# Patient Record
Sex: Female | Born: 1945
Health system: Southern US, Community
[De-identification: ages and names within clinical notes are randomized; demographics above are authoritative.]

## PROBLEM LIST (undated history)

## (undated) DIAGNOSIS — K900 Celiac disease: Secondary | ICD-10-CM

## (undated) DIAGNOSIS — I10 Essential (primary) hypertension: Secondary | ICD-10-CM

## (undated) HISTORY — PX: EYE SURGERY: SHX253

## (undated) HISTORY — PX: VAGINAL HYSTERECTOMY: SUR661

---

## 2000-03-07 ENCOUNTER — Other Ambulatory Visit: Admission: RE | Admit: 2000-03-07 | Discharge: 2000-03-07 | Payer: Self-pay | Admitting: Obstetrics and Gynecology

## 2001-03-05 ENCOUNTER — Other Ambulatory Visit: Admission: RE | Admit: 2001-03-05 | Discharge: 2001-03-05 | Payer: Self-pay | Admitting: Obstetrics and Gynecology

## 2001-10-25 ENCOUNTER — Ambulatory Visit (HOSPITAL_COMMUNITY): Admission: RE | Admit: 2001-10-25 | Discharge: 2001-10-25 | Payer: Self-pay | Admitting: *Deleted

## 2002-06-04 ENCOUNTER — Other Ambulatory Visit: Admission: RE | Admit: 2002-06-04 | Discharge: 2002-06-04 | Payer: Self-pay | Admitting: Obstetrics and Gynecology

## 2004-02-11 ENCOUNTER — Ambulatory Visit (HOSPITAL_COMMUNITY): Admission: RE | Admit: 2004-02-11 | Discharge: 2004-02-11 | Payer: Self-pay | Admitting: Internal Medicine

## 2004-03-04 ENCOUNTER — Ambulatory Visit (HOSPITAL_COMMUNITY): Admission: RE | Admit: 2004-03-04 | Discharge: 2004-03-04 | Payer: Self-pay | Admitting: Internal Medicine

## 2006-04-19 ENCOUNTER — Ambulatory Visit (HOSPITAL_COMMUNITY): Admission: RE | Admit: 2006-04-19 | Discharge: 2006-04-19 | Payer: Self-pay | Admitting: *Deleted

## 2006-04-19 ENCOUNTER — Encounter (INDEPENDENT_AMBULATORY_CARE_PROVIDER_SITE_OTHER): Payer: Self-pay | Admitting: Specialist

## 2008-04-16 ENCOUNTER — Encounter: Admission: RE | Admit: 2008-04-16 | Discharge: 2008-04-16 | Payer: Self-pay | Admitting: Internal Medicine

## 2010-05-18 HISTORY — DX: Hereditary hemochromatosis: E83.110

## 2010-08-26 ENCOUNTER — Other Ambulatory Visit (HOSPITAL_COMMUNITY): Payer: Self-pay | Admitting: Gastroenterology

## 2010-08-26 DIAGNOSIS — R7989 Other specified abnormal findings of blood chemistry: Secondary | ICD-10-CM

## 2010-09-08 ENCOUNTER — Other Ambulatory Visit: Payer: Self-pay | Admitting: Interventional Radiology

## 2010-09-08 ENCOUNTER — Ambulatory Visit (HOSPITAL_COMMUNITY)
Admission: RE | Admit: 2010-09-08 | Discharge: 2010-09-08 | Disposition: A | Discharge status: Home / Self Care | Payer: Medicare Other | Source: Ambulatory Visit | Attending: Gastroenterology | Admitting: Gastroenterology

## 2010-09-08 DIAGNOSIS — R945 Abnormal results of liver function studies: Secondary | ICD-10-CM | POA: Insufficient documentation

## 2010-09-08 DIAGNOSIS — R7989 Other specified abnormal findings of blood chemistry: Secondary | ICD-10-CM

## 2010-09-08 LAB — PROTIME-INR
INR: 0.88 (ref 0.00–1.49)
Prothrombin Time: 12.1 seconds (ref 11.6–15.2)

## 2010-09-08 LAB — APTT: aPTT: 28 seconds (ref 24–37)

## 2010-09-08 LAB — CBC
HCT: 40.4 % (ref 36.0–46.0)
MCHC: 34.9 g/dL (ref 30.0–36.0)

## 2010-10-05 ENCOUNTER — Encounter (HOSPITAL_BASED_OUTPATIENT_CLINIC_OR_DEPARTMENT_OTHER): Payer: Medicare Other | Admitting: Oncology

## 2010-10-05 ENCOUNTER — Other Ambulatory Visit: Payer: Self-pay | Admitting: Oncology

## 2010-10-05 LAB — COMPREHENSIVE METABOLIC PANEL
AST: 31 U/L (ref 0–37)
Albumin: 4.4 g/dL (ref 3.5–5.2)
CO2: 23 mEq/L (ref 19–32)
Calcium: 9 mg/dL (ref 8.4–10.5)
Chloride: 105 mEq/L (ref 96–112)
Glucose, Bld: 99 mg/dL (ref 70–99)
Potassium: 4.4 mEq/L (ref 3.5–5.3)
Sodium: 139 mEq/L (ref 135–145)
Total Bilirubin: 0.4 mg/dL (ref 0.3–1.2)

## 2010-10-05 LAB — CBC & DIFF AND RETIC
EOS%: 1.5 % (ref 0.0–7.0)
HCT: 41 % (ref 34.8–46.6)
Immature Retic Fract: 3.3 % (ref 0.00–10.70)
MCH: 34.7 pg — ABNORMAL HIGH (ref 25.1–34.0)
MCHC: 34.1 g/dL (ref 31.5–36.0)
MCV: 101.7 fL — ABNORMAL HIGH (ref 79.5–101.0)
MONO#: 0.5 10*3/uL (ref 0.1–0.9)
NEUT#: 2.7 10*3/uL (ref 1.5–6.5)
NEUT%: 52.4 % (ref 38.4–76.8)
Platelets: 246 10*3/uL (ref 145–400)
Retic %: 1.28 % (ref 0.50–1.50)
WBC: 5.2 10*3/uL (ref 3.9–10.3)

## 2010-10-05 LAB — FERRITIN: Ferritin: 461 ng/mL — ABNORMAL HIGH (ref 10–291)

## 2010-10-05 LAB — IRON AND TIBC: TIBC: 266 ug/dL (ref 250–470)

## 2010-10-25 ENCOUNTER — Encounter (HOSPITAL_COMMUNITY): Payer: Medicare Other | Attending: Oncology

## 2010-11-01 ENCOUNTER — Encounter (HOSPITAL_COMMUNITY): Payer: Medicare Other | Attending: Oncology

## 2010-11-01 ENCOUNTER — Other Ambulatory Visit: Payer: Self-pay | Admitting: Oncology

## 2010-11-01 LAB — HEMOGLOBIN AND HEMATOCRIT, BLOOD
HCT: 39.9 % (ref 36.0–46.0)
Hemoglobin: 13.3 g/dL (ref 12.0–15.0)

## 2010-11-01 LAB — FERRITIN: Ferritin: 360 ng/mL — ABNORMAL HIGH (ref 10–291)

## 2010-11-22 ENCOUNTER — Other Ambulatory Visit: Payer: Self-pay | Admitting: Oncology

## 2010-11-22 ENCOUNTER — Encounter (HOSPITAL_COMMUNITY)
Admission: RE | Admit: 2010-11-22 | Discharge: 2010-11-22 | Disposition: A | Discharge status: Home / Self Care | Payer: Medicare Other | Source: Ambulatory Visit | Attending: Oncology | Admitting: Oncology

## 2010-11-22 LAB — FERRITIN: Ferritin: 277 ng/mL (ref 10–291)

## 2010-12-16 NOTE — Op Note (Signed)
NAME:  Jessica Conley, Jessica Conley NO.:  [id number]   MEDICAL RECORD NO.:  [id number]          PATIENT TYPE:  AMB   LOCATION:  ENDO                         FACILITY:  [facility]   PHYSICIAN:  Waverly Ferrari, M.D.    DATE OF BIRTH:  09-Aug-1945   DATE OF PROCEDURE:  04/19/2006  DATE OF DISCHARGE:                                 OPERATIVE REPORT   PROCEDURE:  Enteroscopy.   INDICATIONS:  Question of celiac disease.   ANESTHESIA:  Demerol 50 mg, Versed 5 mg.   PROCEDURE:  With the patient mildly sedated in the left lateral decubitus  position, the Olympus videoscopic pediatric colonoscope was inserted into  the mouth and then passed under direct vision into the esophagus, which  appeared normal until we reached the distal esophagus and there appeared to  be some possibly area of Barrett's, photographed and biopsied.  We entered  into the stomach.  The fundus, body, antrum appeared normal.  The duodenal  bulb showed changes of duodenitis that we subsequently biopsied.  We passed  the enteroscope further to the small bowel, and scalloping was seen of the  small intestinal mucosa.  This was photographed and biopsies were taken to  rule out celiac sprue.  The endoscope was then withdrawn taking  circumferential views of the duodenal mucosa until the endoscope had been  pulled back into the stomach, placed in retroflexion to view the stomach  from below, and a hiatal hernia was seen.  The endoscope was straightened  and withdrawn taking circumferential views of the remaining gastric and  esophageal mucosa, stopping to biopsy the distal esophagus.  The patient's  vital signs and pulse oximeter remained stable.  The patient tolerated the  procedure well without apparent complication.   FINDINGS:  1. Changes of possible celiac sprue seen in the small bowel.  2. Duodenitis limited to the duodenal bulb.  3. Question of Barrett's esophagus versus mild esophagitis of the distal  esophagus.  4. Hiatal hernia.   PLAN:  Await biopsy report.  The patient will call me for results and follow  up with me as an outpatient.           ______________________________  Waverly Ferrari, M.D.     GMO/MEDQ  D:  04/19/2006  T:  04/20/2006  Job:  [id number]

## 2010-12-16 NOTE — Procedures (Signed)
[facility]  Patient:    Jessica Conley, Jessica Conley Visit Number: [id number] MRN: [id number]          Service Type: END Location: ENDO Attending Physician:  Jim Desanctis Dictated by:   Jim Desanctis, M.D. Proc. Date: 10/25/01 Admit Date:  10/25/2001                             Procedure Report  PROCEDURE:  Colonoscopy.  INDICATION FOR PROCEDURE:  Colon cancer screening.  ANESTHESIA:  None further given.  DESCRIPTION OF PROCEDURE:  With the patient mildly sedated in the left lateral decubitus position, the Olympus videoscopic colonoscope was inserted in the rectum and passed under direct vision to the cecum identified by the ileocecal valve and appendiceal orifice. From this point, the colonoscope was slowly withdrawn taking circumferential views of the entire colonic mucosa, stopping only in the rectum which appeared normal in direct and showed hemorrhoids in retroflexed view. The endoscope was straightened and withdrawn. The patients vital signs and pulse oximeter remained stable. The patient tolerated the procedure well without apparent complications.  FINDINGS:  Internal hemorrhoids otherwise unremarkable examination. Dictated by:   Jim Desanctis, M.D. Attending Physician:  Jim Desanctis DD:  10/25/01 TD:  10/25/01 Job: [id number] [id number]

## 2010-12-16 NOTE — Procedures (Signed)
[facility]  Patient:    Jessica Conley, Jessica Conley Visit Number: [id number] MRN: [id number]          Service Type: END Location: ENDO Attending Physician:  Jim Desanctis Dictated by:   Jim Desanctis, M.D. Proc. Date: 10/25/01 Admit Date:  10/25/2001                             Procedure Report  PROCEDURE:  Upper endoscopy.  INDICATIONS:  Gastroesophageal reflux disease.  ANESTHESIA:  Demerol 70 mg, Versed 6 mg.  DESCRIPTION OF PROCEDURE:  With the patient mildly sedated in the left lateral decubitus position, the Olympus videoscopic endoscope was inserted in the mouth and passed under direct vision through the esophagus, which appeared normal into the stomach. Fundus, body, antrum, duodenal bulb, second portion of the duodenum were all visualized. From this point, the endoscope was slowly withdrawn taking circumferential views of the entire duodenal mucosa until the endoscope was then pulled back into the stomach, placed in retroflexion to view the stomach from below. The endoscope was then straightened and withdrawn taking circumferential views of the remaining gastric and esophageal mucosa. The patients vital signs and pulse oximeter remained stable. The patient tolerated the procedure well without apparent complications.  FINDINGS:  Very mild duodenitis, which could possibly have been secondary to the prep.  PLAN:  Continue present therapy. Proceed to colonoscopy as planned. Dictated by:   Jim Desanctis, M.D. Attending Physician:  Jim Desanctis DD:  10/25/01 TD:  10/25/01 Job: [id number]

## 2010-12-20 ENCOUNTER — Encounter (HOSPITAL_COMMUNITY): Payer: Medicare Other | Attending: Oncology

## 2011-08-07 ENCOUNTER — Encounter (HOSPITAL_COMMUNITY): Payer: Self-pay

## 2011-08-07 ENCOUNTER — Other Ambulatory Visit (HOSPITAL_COMMUNITY): Payer: Self-pay | Admitting: *Deleted

## 2011-08-07 ENCOUNTER — Ambulatory Visit (HOSPITAL_COMMUNITY)
Admission: RE | Admit: 2011-08-07 | Discharge: 2011-08-07 | Disposition: A | Discharge status: Home / Self Care | Payer: Medicare Other | Source: Ambulatory Visit | Attending: Internal Medicine | Admitting: Internal Medicine

## 2011-08-07 HISTORY — DX: Essential (primary) hypertension: I10

## 2011-08-07 HISTORY — DX: Hereditary hemochromatosis: E83.110

## 2011-08-07 MED ORDER — SODIUM CHLORIDE 0.9 % IV BOLUS (SEPSIS)
500.0000 mL | Freq: Once | INTRAVENOUS | Status: AC
  Administered 2011-08-07: 500 mL via INTRAVENOUS

## 2011-08-07 NOTE — Procedures (Signed)
Right a/c #18 ga and 500 cc blood obtained over 12 minutes.  VSS prior to phlebotomy were 163/93 At the end of procedure 81/42 p 49 Pt c/o of some dizziness HOB placed in flat postion. BP decreased to 62/37 p 48 Saline connected to IV site wide open >BP74/79 Placed call to Dr Maudie Mercury and received orders . Continuing to monitor pt. Pt awake and alert ,pink slightly cool BP 91/60 p= 48  BP 105/70 p=66

## 2011-08-07 NOTE — Progress Notes (Signed)
After IV bolus of 500 cc and 2 glasses of juice HOB 45 degrees and BP 125/82 Sitting on edge of bed 124/80  p 66

## 2011-08-10 ENCOUNTER — Other Ambulatory Visit (HOSPITAL_COMMUNITY): Payer: Self-pay | Admitting: *Deleted

## 2011-08-14 ENCOUNTER — Encounter (HOSPITAL_COMMUNITY)
Admission: RE | Admit: 2011-08-14 | Discharge: 2011-08-14 | Disposition: A | Discharge status: Home / Self Care | Payer: Medicare Other | Source: Ambulatory Visit | Attending: Internal Medicine | Admitting: Internal Medicine

## 2011-08-14 ENCOUNTER — Encounter (HOSPITAL_COMMUNITY): Payer: Self-pay

## 2011-08-14 MED ORDER — SODIUM CHLORIDE 0.9 % IV SOLN
INTRAVENOUS | Status: DC
  Administered 2011-08-14: 14:00:00 via INTRAVENOUS

## 2011-08-14 NOTE — Procedures (Signed)
Removed 250cc blood over 15 minutes after an infusion of 500 cc NS BP 138/80 p84

## 2011-08-15 ENCOUNTER — Encounter (HOSPITAL_COMMUNITY): Payer: Medicare Other

## 2011-08-16 ENCOUNTER — Encounter (HOSPITAL_COMMUNITY): Payer: Medicare Other

## 2011-08-17 ENCOUNTER — Encounter (HOSPITAL_COMMUNITY): Payer: Medicare Other

## 2011-08-18 ENCOUNTER — Encounter (HOSPITAL_COMMUNITY): Payer: Medicare Other

## 2011-08-21 ENCOUNTER — Encounter (HOSPITAL_COMMUNITY)
Admission: RE | Admit: 2011-08-21 | Discharge: 2011-08-21 | Disposition: A | Discharge status: Home / Self Care | Payer: Medicare Other | Source: Ambulatory Visit | Attending: Internal Medicine | Admitting: Internal Medicine

## 2011-08-21 ENCOUNTER — Encounter (HOSPITAL_COMMUNITY): Payer: Self-pay

## 2011-08-21 ENCOUNTER — Encounter (HOSPITAL_COMMUNITY): Payer: Medicare Other

## 2011-08-21 HISTORY — PX: PHLEBOTOMY THERAPEUTIC: PRO51

## 2011-08-21 MED ORDER — SODIUM CHLORIDE 0.9 % IV BOLUS (SEPSIS)
500.0000 mL | INTRAVENOUS | Status: DC
  Administered 2011-08-21 (×2): 500 mL via INTRAVENOUS

## 2011-08-21 NOTE — Discharge Instructions (Signed)
Phlebotomy, Pediatric There are times when children need to have blood tests done as part of routine health care or due to illness. In most cases, blood is obtained by inserting a needle into a vein and drawing a blood sample (phlebotomy) into a special tube or into a syringe.  In newborns, blood may be obtained by making a tiny puncture on a specific area of the heel (called a "heel stick"). This is usually done by using a tiny needle called a "lancet". Drops of blood are then collected in small tubes which are sent to the lab. This same method can be used for infants and older children to obtain small amounts of bloods from the fingers. When larger amounts of blood are needed from newborn babies, the veins on the arms, hands, tops of feet, on the scalp, and on the neck might also be used to obtain blood samples.  LET YOUR CAREGIVER KNOW ABOUT:   Allergies.   History of severe bleeding problems.   Previous reaction to numbing medicines.   Current medications, including medications on the skin, in the eyes or mouth.  RISKS AND COMPLICATIONS  Most needle sticks to draw blood are quick, relatively painless, and without side effects or complications. However, as with any procedure, there are always possible risks. They include:   Pain.   Inability to locate the vein and/or obtain blood.   Bleeding from the site after the needle is removed.   Bruising at the site.   Allergic reaction to numbing medicine used before the needle is inserted.  BEFORE THE PROCEDURE  Warming devices may be used to help increase the blood supply in the vein or in the area of the heel where the blood will be obtained. This may make it easier to obtain the blood.  In infants, toddlers, and young children, it is usually necessary to do something to hold the child very still. Parents may be asked to both hold the child still and help comfort the child at the same time. Restraining the child tightly may seem cruel, but it  actually makes it quicker, easier, and safer to obtain the blood.  Before inserting the needle, the skin over the vein or the heel is cleansed with alcohol or another antiseptic solution. The skin over the vein may be covered with an anesthetic cream to reduce pain when the needle is passed through the skin. Distraction of the child through reading, talking, singing, or playing may help to make the procedure quicker and more comfortable for the child.  AFTER THE PROCEDURE  After the blood is obtained and the needle removed, a clean gauze pad is held over the site and pressure is applied to stop bleeding and minimize swelling. The gauze is then either taped in place or replaced with an adhesive bandage. HOME CARE INSTRUCTIONS   Keep the bandage in place for 2 to 3 hours before removal. Heavy activity at the puncture site (such as throwing a ball or playing on a jungle gym) should be avoided during this time if an arm vein was used.   After 2 to 3 hours, the gauze may be removed and discarded. If there is any oozing of blood cover the site with a clean bandage strip.   Generally, pain only occurs while the needle is in place, but after removal, the pain goes away quickly. Only take over-the-counter or prescription medicines for pain, discomfort, or fever as directed by your caregiver.   Showering may be done normally  after the gauze is removed. If bleeding recurs while showering, the site should be dabbed dry (avoid rubbing the area) and an adhesive bandage applied.  SEEK MEDICAL CARE IF:   A large bruise at the site of needle insertion.   The site continues to ooze blood after several hours.   A rash develops around the needle site.   Fever develops.  SEEK IMMEDIATE MEDICAL CARE IF:   Bleeding from the site develops and will not stop even after applying firm pressure to the site for 10 minutes.   Red streaking or tenderness develops above and/or below the site.   There is loss of  sensation and/or weakness in the arm or the leg where the needle had been placed.   There is intense pain in the arm or leg where the needle had been placed.  Document Released: 11/28/2006 Document Revised: 03/29/2011 Document Reviewed: 07/05/2007 [facility] Patient Information 2012 [place].

## 2011-08-21 NOTE — Procedures (Addendum)
250cc of whole blood taken from pt via 20 gauge angiocath left arm. Pt tolerated well. To stay 1 hour post phlebotomy.  Pt received 400 cc bolus of NS. To receive last 100 cc of saline over 1 hour post phlebotomy

## 2011-08-23 ENCOUNTER — Emergency Department (HOSPITAL_COMMUNITY)
Admission: EM | Admit: 2011-08-23 | Discharge: 2011-08-23 | Disposition: A | Discharge status: Home / Self Care | Payer: Medicare Other | Attending: Emergency Medicine | Admitting: Emergency Medicine

## 2011-08-23 ENCOUNTER — Other Ambulatory Visit: Payer: Self-pay

## 2011-08-23 ENCOUNTER — Encounter (HOSPITAL_COMMUNITY): Payer: Self-pay | Admitting: Emergency Medicine

## 2011-08-23 DIAGNOSIS — R531 Weakness: Secondary | ICD-10-CM

## 2011-08-23 DIAGNOSIS — R5381 Other malaise: Secondary | ICD-10-CM | POA: Insufficient documentation

## 2011-08-23 DIAGNOSIS — I10 Essential (primary) hypertension: Secondary | ICD-10-CM | POA: Insufficient documentation

## 2011-08-23 DIAGNOSIS — R42 Dizziness and giddiness: Secondary | ICD-10-CM

## 2011-08-23 DIAGNOSIS — R55 Syncope and collapse: Secondary | ICD-10-CM | POA: Insufficient documentation

## 2011-08-23 HISTORY — DX: Celiac disease: K90.0

## 2011-08-23 LAB — URINALYSIS, ROUTINE W REFLEX MICROSCOPIC
Bilirubin Urine: NEGATIVE
Glucose, UA: NEGATIVE mg/dL
Hgb urine dipstick: NEGATIVE
Specific Gravity, Urine: 1.011 (ref 1.005–1.030)
Urobilinogen, UA: 0.2 mg/dL (ref 0.0–1.0)
pH: 8 (ref 5.0–8.0)

## 2011-08-23 LAB — DIFFERENTIAL
Eosinophils Absolute: 0 10*3/uL (ref 0.0–0.7)
Lymphocytes Relative: 22 % (ref 12–46)
Lymphs Abs: 1.4 10*3/uL (ref 0.7–4.0)
Monocytes Relative: 7 % (ref 3–12)
Neutro Abs: 4.5 10*3/uL (ref 1.7–7.7)
Neutrophils Relative %: 71 % (ref 43–77)

## 2011-08-23 LAB — BASIC METABOLIC PANEL
BUN: 8 mg/dL (ref 6–23)
CO2: 26 mEq/L (ref 19–32)
Chloride: 105 mEq/L (ref 96–112)
Glucose, Bld: 114 mg/dL — ABNORMAL HIGH (ref 70–99)
Potassium: 3.5 mEq/L (ref 3.5–5.1)
Sodium: 142 mEq/L (ref 135–145)

## 2011-08-23 LAB — CBC
Hemoglobin: 12.6 g/dL (ref 12.0–15.0)
MCH: 35.2 pg — ABNORMAL HIGH (ref 26.0–34.0)
Platelets: 286 10*3/uL (ref 150–400)
RBC: 3.58 MIL/uL — ABNORMAL LOW (ref 3.87–5.11)
WBC: 6.4 10*3/uL (ref 4.0–10.5)

## 2011-08-23 MED ORDER — SODIUM CHLORIDE 0.9 % IV BOLUS (SEPSIS)
1000.0000 mL | Freq: Once | INTRAVENOUS | Status: AC
  Administered 2011-08-23: 1000 mL via INTRAVENOUS

## 2011-08-23 NOTE — ED Notes (Signed)
Had episode of weakness while attending funeral this morning. Has given blood on Monday- hx of hemochromotosis-feels weak sometimes after giving blood. To ED via [facility] A/Ox3 pink, w/d.

## 2011-08-23 NOTE — ED Provider Notes (Addendum)
History     CSN: [id number]  Arrival date & time 08/23/11  7106   First MD Initiated Contact with Patient 08/23/11 1759      Chief Complaint  Patient presents with  . Near Syncope  . Weakness    (Consider location/radiation/quality/duration/timing/severity/associated sxs/prior treatment) Patient is a 66 y.o. female presenting with weakness. The history is provided by the patient.  Weakness Primary symptoms do not include headaches, loss of consciousness, dizziness, paresthesias, focal weakness, loss of sensation, speech change, fever, nausea or vomiting. Primary symptoms comment: Near-syncope The symptoms began 6 to 12 hours ago (Patient has had these intermittent symptoms for the last 2 weeks. She started treatment for hemochromatosis where she gives blood which started 2 weeks ago the first day she gave blood she syncopized but she has not passed out since. ). Episode duration: She started feeling faint and lightheaded while she was having funeral at 11 AM this morning. The symptoms are unchanged. The neurological symptoms are diffuse. The symptoms occurred after standing up and on exertion (Worse with standing).  Additional symptoms include weakness. Additional symptoms do not include pain, loss of balance, photophobia, tinnitus or vertigo. Medical issues do not include seizures, cerebral vascular accident, cancer or diabetes.    Past Medical History  Diagnosis Date  . Hemochromatosis, hereditary   . Hypertension   . Celiac sprue     Past Surgical History  Procedure Date  . Eye surgery   . Abdominal hysterectomy   . Phlebotomy therapeutic 08/21/2011         History reviewed. No pertinent family history.  History  Substance Use Topics  . Smoking status: Never Smoker   . Smokeless tobacco: Not on file  . Alcohol Use: 0.6 oz/week    1 Glasses of wine per week    OB History    Grav Para Term Preterm Abortions TAB SAB Ect Mult Living                  Review of  Systems  Constitutional: Negative for fever.  HENT: Negative for tinnitus.   Eyes: Negative for photophobia.  Respiratory: Negative for cough and shortness of breath.   Cardiovascular: Negative for chest pain and leg swelling.  Gastrointestinal: Negative for nausea and vomiting.  Neurological: Positive for weakness and light-headedness. Negative for dizziness, vertigo, speech change, focal weakness, loss of consciousness, headaches, paresthesias and loss of balance.  All other systems reviewed and are negative.    Allergies  Penicillins  Home Medications   Current Outpatient Rx  Name Route Sig Dispense Refill  . CALCIUM + D PO Oral Take 1 tablet by mouth daily.    Marland Kitchen VITAMIN D 1000 UNITS PO TABS Oral Take 1,000 Units by mouth daily.     Marland Kitchen VITAMIN B-12 SL Sublingual Place 1 tablet under the tongue daily.    Marland Kitchen FOLIC ACID PO Oral Take 1 tablet by mouth daily.    Marland Kitchen LOSARTAN POTASSIUM 50 MG PO TABS Oral Take 50 mg by mouth daily.    . ADULT MULTIVITAMIN W/MINERALS CH Oral Take 1 tablet by mouth daily.    Marland Kitchen PRAVASTATIN SODIUM 20 MG PO TABS Oral Take 20 mg by mouth daily.      BP 137/89  Pulse 94  Temp(Src) 97.5 F (36.4 C) (Oral)  Resp 16  SpO2 100%  Physical Exam  Nursing note and vitals reviewed. Constitutional: She is oriented to person, place, and time. She appears well-developed and well-nourished. No distress.  HENT:  Head: Normocephalic and atraumatic.  Mouth/Throat: Oropharynx is clear and moist.  Eyes: EOM are normal. Pupils are equal, round, and reactive to light.  Cardiovascular: Normal rate, regular rhythm, normal heart sounds and intact distal pulses.  Exam reveals no friction rub.   No murmur heard. Pulmonary/Chest: Effort normal and breath sounds normal. She has no wheezes. She has no rales.  Abdominal: Soft. Bowel sounds are normal. She exhibits no distension. There is no tenderness. There is no rebound and no guarding.  Musculoskeletal: Normal range of motion.  She exhibits no tenderness.       No edema  Neurological: She is alert and oriented to person, place, and time. No cranial nerve deficit.  Skin: Skin is warm and dry. No rash noted. No pallor.  Psychiatric: She has a normal mood and affect. Her behavior is normal.    ED Course  Procedures (including critical care time)  Labs Reviewed  CBC - Abnormal; Notable for the following:    RBC 3.58 (*)    MCV 101.7 (*)    MCH 35.2 (*)    All other components within normal limits  BASIC METABOLIC PANEL - Abnormal; Notable for the following:    Glucose, Bld 114 (*)    All other components within normal limits  DIFFERENTIAL  URINALYSIS, ROUTINE W REFLEX MICROSCOPIC   No results found.   Date: 08/23/2011  Rate: 93  Rhythm: normal sinus rhythm  QRS Axis: left  Intervals: normal  ST/T Wave abnormalities: normal  Conduction Disutrbances:none  Narrative Interpretation:   Old EKG Reviewed: none available    No diagnosis found.    MDM   Patient with a complaint of generalized weakness there has been intermittent for the last 2 weeks. She has a history of hemochromatosis and gets intermittent blood draws. She started the treatments again 2 weeks ago where she gave a pint of blood and afterwards felt very weak and syncopized. She states that she's felt felt intermittently weak since the first time getting blood 2 weeks ago. She gave again on Monday and states that she felt okay but today while she was sitting at a funeral she felt very lightheaded and weak. There is no focal symptoms suggestive of stroke she denies any infectious symptoms. She has no cardiac symptoms such as chest pain, shortness of breath, diaphoresis, nausea, vomiting. She denies any change in medications she has not missed any medications. She states the last time she was going through hemochromatosis the treatments she had similar symptoms which was back in July. On exam today patient is asymptomatic with lying and has a  normal exam. Orthostatics she had a blood pressure of 143/77 lying and no 137/89 standing. Without a significant change in pulse. She has no symptoms suggestive of a PE or infectious process. EKG within normal limits. CBC within normal blood count of 12. BMP and UA pending.   9:11 PM Patient with normal BMP and UA. An iron ferritin was sent and her doctor can followup on that.  After fluid she had mild improvement. Will have her followup with Dr. Maudie Mercury for further treatment.  Patient was able to ambulate to the bathroom without difficulty    Blanchie Dessert, MD 08/23/11 2112  Blanchie Dessert, MD 08/23/11 2134

## 2011-08-23 NOTE — ED Notes (Signed)
JHH:[id number]<BD> Expected date:08/23/11<BR> Expected time: 5:12 PM<BR> Means of arrival:Ambulance<BR> Comments:<BR> EMS 72 GC, 54 yof near syncope

## 2011-08-24 LAB — FERRITIN: Ferritin: 174 ng/mL (ref 10–291)

## 2011-08-28 ENCOUNTER — Encounter: Payer: Self-pay | Admitting: Oncology

## 2011-08-28 ENCOUNTER — Encounter (HOSPITAL_COMMUNITY): Admission: RE | Admit: 2011-08-28 | Payer: Medicare Other | Source: Ambulatory Visit

## 2011-09-04 ENCOUNTER — Encounter (HOSPITAL_COMMUNITY): Payer: Medicare Other

## 2011-09-11 ENCOUNTER — Encounter (HOSPITAL_COMMUNITY): Payer: Medicare Other

## 2011-09-15 ENCOUNTER — Other Ambulatory Visit (HOSPITAL_COMMUNITY): Payer: Self-pay | Admitting: *Deleted

## 2011-09-15 MED ORDER — SODIUM CHLORIDE 0.9 % IV BOLUS (SEPSIS): 500.0000 mL | INTRAVENOUS | Status: DC

## 2011-09-18 ENCOUNTER — Encounter (HOSPITAL_COMMUNITY): Payer: Medicare Other

## 2011-09-28 ENCOUNTER — Encounter (HOSPITAL_COMMUNITY)
Admission: RE | Admit: 2011-09-28 | Discharge: 2011-09-28 | Disposition: A | Discharge status: Home / Self Care | Payer: Medicare Other | Source: Ambulatory Visit | Attending: Internal Medicine | Admitting: Internal Medicine

## 2011-09-28 ENCOUNTER — Encounter (HOSPITAL_COMMUNITY): Payer: Self-pay

## 2011-09-28 LAB — CBC
HCT: 41.2 % (ref 36.0–46.0)
MCV: 102 fL — ABNORMAL HIGH (ref 78.0–100.0)
RBC: 4.04 MIL/uL (ref 3.87–5.11)
RDW: 12.1 % (ref 11.5–15.5)
WBC: 4.9 10*3/uL (ref 4.0–10.5)

## 2011-09-28 LAB — IRON AND TIBC
Iron: 129 ug/dL (ref 42–135)
Saturation Ratios: 48 % (ref 20–55)
TIBC: 270 ug/dL (ref 250–470)

## 2011-09-28 MED ORDER — SODIUM CHLORIDE 0.9 % IV SOLN: INTRAVENOUS | Status: DC

## 2011-09-28 NOTE — Procedures (Signed)
Prior to phlebotomy labs drawn and 250cc NS infused.  Phlebotomy started at 09:35am and ended at 09:45am, 250cc removed.  Pt tolerated well.  VSS. Pt stayed 9mnutes after procedure and left via ambulation.

## 2011-09-29 ENCOUNTER — Other Ambulatory Visit (HOSPITAL_COMMUNITY): Payer: Self-pay | Admitting: *Deleted

## 2011-10-23 ENCOUNTER — Inpatient Hospital Stay (HOSPITAL_COMMUNITY): Admission: RE | Admit: 2011-10-23 | Payer: Medicare Other | Source: Ambulatory Visit

## 2011-10-23 ENCOUNTER — Inpatient Hospital Stay (HOSPITAL_COMMUNITY)
Admission: RE | Admit: 2011-10-23 | Discharge: 2011-10-23 | Disposition: A | Discharge status: Home / Self Care | Payer: Medicare Other | Source: Ambulatory Visit | Attending: Internal Medicine | Admitting: Internal Medicine

## 2011-10-23 MED ORDER — SODIUM CHLORIDE 0.9 % IV SOLN
INTRAVENOUS | Status: DC
  Administered 2011-10-23: 16:00:00 via INTRAVENOUS

## 2011-10-23 NOTE — Discharge Instructions (Signed)
Return April 23 at 1030  Drink plenty of fluids today Light activity today

## 2011-10-24 ENCOUNTER — Encounter (HOSPITAL_COMMUNITY): Admission: RE | Admit: 2011-10-24 | Payer: Medicare Other | Source: Ambulatory Visit

## 2011-11-03 ENCOUNTER — Other Ambulatory Visit: Payer: Medicare Other | Admitting: Lab

## 2011-11-07 ENCOUNTER — Ambulatory Visit: Payer: Medicare Other | Admitting: Oncology

## 2011-11-08 ENCOUNTER — Encounter: Payer: Self-pay | Admitting: Oncology

## 2011-11-08 NOTE — Progress Notes (Signed)
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 Iron saturation 76%. Baseline ferritin 461. She was started on a every 2 week phlebotomy program beginning on 10/25/2010. She has not returned from his subsequent visits or lab monitoring. We will need to call and see if she still wants to be followed in our practice.  CC note Dr. Jani Gravel; Dr. Juanita Craver

## 2011-11-21 ENCOUNTER — Encounter (HOSPITAL_COMMUNITY): Payer: Self-pay

## 2011-11-21 ENCOUNTER — Encounter (HOSPITAL_COMMUNITY)
Admission: RE | Admit: 2011-11-21 | Discharge: 2011-11-21 | Disposition: A | Discharge status: Home / Self Care | Payer: Medicare Other | Source: Ambulatory Visit | Attending: Internal Medicine | Admitting: Internal Medicine

## 2011-11-21 LAB — CBC
Hemoglobin: 13.4 g/dL (ref 12.0–15.0)
MCH: 34.1 pg — ABNORMAL HIGH (ref 26.0–34.0)
MCV: 101.3 fL — ABNORMAL HIGH (ref 78.0–100.0)
RBC: 3.93 MIL/uL (ref 3.87–5.11)

## 2011-11-21 LAB — IRON AND TIBC
Saturation Ratios: 34 % (ref 20–55)
UIBC: 167 ug/dL (ref 125–400)

## 2011-11-21 MED ORDER — SODIUM CHLORIDE 0.9 % IV SOLN
INTRAVENOUS | Status: DC
  Administered 2011-11-21: 12:00:00 via INTRAVENOUS

## 2011-11-21 NOTE — Progress Notes (Signed)
Phlebotomized 250 mls of blood from patient's right AC. Patient tolerated well. Patient did not pass out during this treatment.

## 2011-11-21 NOTE — Discharge Instructions (Signed)
Therapeutic Phlebotomy Therapeutic phlebotomy is the controlled removal of blood from your body for the purpose of treating a medical condition. It is similar to donating blood. Usually, about a pint (470 mL) of blood is removed. The average adult has 9 to 12 pints (4.3 to 5.7 L) of blood. Therapeutic phlebotomy may be used to treat the following medical conditions:  Hemochromatosis. This is a condition in which there is too much iron in the blood.   Polycythemia vera. This is a condition in which there are too many red cells in the blood.   Porphyria cutanea tarda. This is a disease usually passed from one generation to the next (inherited). It is a condition in which an important part of hemoglobin is not made properly. This results in the build up of abnormal amounts of porphyrins in the body.   Sickle cell disease. This is an inherited disease. It is a condition in which the red blood cells form an abnormal crescent shape rather than a round shape.  LET YOUR CAREGIVER KNOW ABOUT:  Allergies.   Medicines taken including herbs, eyedrops, over-the-counter medicines, and creams.   Use of steroids (by mouth or creams).   Previous problems with anesthetics or numbing medicine.   History of blood clots.   History of bleeding or blood problems.   Previous surgery.   Possibility of pregnancy, if this applies.  RISKS AND COMPLICATIONS This is a simple and safe procedure. Problems are unlikely. However, problems can occur and may include:  Nausea or lightheadedness.   Low blood pressure.   Soreness, bleeding, swelling, or bruising at the needle insertion site.   Infection.  BEFORE THE PROCEDURE  This is a procedure that can be done as an outpatient. Confirm the time that you need to arrive for your procedure. Confirm whether there is a need to fast or withhold any medications. It is helpful to wear clothing with sleeves that can be raised above the elbow. A blood sample may be done  to determine the amount of red blood cells or iron in your blood. Plan ahead of time to have someone drive you home after the procedure. PROCEDURE The entire procedure from preparation through recovery takes about 1 hour. The actual collection takes about 10 to 15 minutes.  A needle will be inserted into your vein.   Tubing and a collection bag will be attached to that needle.   Blood will flow through the needle and tubing into the collection bag.   You may be asked to open and close your hand slowly and continuously during the entire collection.   Once the specified amount of blood has been removed from your body, the collection bag and tubing will be clamped.   The needle will be removed.   Pressure will be held on the site of the needle insertion to stop the bleeding. Then a bandage will be placed over the needle insertion site.  AFTER THE PROCEDURE  Your recovery will be assessed and monitored. If there are no problems, as an outpatient, you should be able to go home shortly after the procedure.  Document Released: 12/19/2010 Document Revised: 07/06/2011 Document Reviewed: 12/19/2010 [facility] Patient Information 2012 [place], [place].

## 2011-12-19 ENCOUNTER — Encounter (HOSPITAL_COMMUNITY): Admission: RE | Admit: 2011-12-19 | Payer: Medicare Other | Source: Ambulatory Visit

## 2012-05-17 ENCOUNTER — Other Ambulatory Visit (HOSPITAL_COMMUNITY): Payer: Self-pay | Admitting: *Deleted

## 2012-05-22 ENCOUNTER — Encounter (HOSPITAL_COMMUNITY): Payer: Self-pay

## 2012-05-22 ENCOUNTER — Encounter (HOSPITAL_COMMUNITY)
Admission: RE | Admit: 2012-05-22 | Discharge: 2012-05-22 | Disposition: A | Discharge status: Home / Self Care | Payer: Medicare Other | Source: Ambulatory Visit | Attending: Internal Medicine | Admitting: Internal Medicine

## 2012-05-22 MED ORDER — SODIUM CHLORIDE 0.9 % IV SOLN
INTRAVENOUS | Status: DC
  Administered 2012-05-22: 250 mL via INTRAVENOUS

## 2012-05-22 NOTE — Procedures (Signed)
Pt phlebotomized for 125 cc of whole blood. Kept for 30 minutes after procedure. Tolerated well.

## 2012-06-19 ENCOUNTER — Encounter (HOSPITAL_COMMUNITY)
Admission: RE | Admit: 2012-06-19 | Discharge: 2012-06-19 | Disposition: A | Discharge status: Home / Self Care | Payer: Medicare Other | Source: Ambulatory Visit | Attending: Internal Medicine | Admitting: Internal Medicine

## 2012-06-19 MED ORDER — SODIUM CHLORIDE 0.9 % IV SOLN
INTRAVENOUS | Status: DC
  Administered 2012-06-19: 250 mL via INTRAVENOUS

## 2012-06-19 NOTE — Procedures (Addendum)
After infusion of 250cc NS over 1 hour    , theraputic  phlebotomy of 125cc of blood obtained over 15 minutes. Pt tolerated this well

## 2012-07-17 ENCOUNTER — Encounter (HOSPITAL_COMMUNITY): Payer: Medicare Other

## 2012-07-26 ENCOUNTER — Inpatient Hospital Stay (HOSPITAL_COMMUNITY): Admission: RE | Admit: 2012-07-26 | Payer: Medicare Other | Source: Ambulatory Visit

## 2012-07-29 ENCOUNTER — Encounter (HOSPITAL_COMMUNITY): Payer: Self-pay

## 2012-07-29 ENCOUNTER — Encounter (HOSPITAL_COMMUNITY)
Admission: RE | Admit: 2012-07-29 | Discharge: 2012-07-29 | Disposition: A | Discharge status: Home / Self Care | Payer: Medicare Other | Source: Ambulatory Visit | Attending: Internal Medicine | Admitting: Internal Medicine

## 2012-07-29 MED ORDER — SODIUM CHLORIDE 0.9 % IV SOLN
INTRAVENOUS | Status: AC
  Administered 2012-07-29: 11:00:00 via INTRAVENOUS

## 2012-07-29 NOTE — Procedures (Signed)
Pt plblebotomized for 125cc of blood over 10 minutes. This is after 250 cc of NS. Tolerated well.

## 2012-08-12 ENCOUNTER — Other Ambulatory Visit (HOSPITAL_COMMUNITY): Payer: Self-pay | Admitting: Internal Medicine

## 2012-08-21 ENCOUNTER — Other Ambulatory Visit (HOSPITAL_COMMUNITY): Payer: Self-pay | Admitting: Internal Medicine

## 2012-09-03 ENCOUNTER — Encounter (HOSPITAL_COMMUNITY)
Admission: RE | Admit: 2012-09-03 | Discharge: 2012-09-03 | Disposition: A | Discharge status: Home / Self Care | Payer: Medicare Other | Source: Ambulatory Visit | Attending: Internal Medicine | Admitting: Internal Medicine

## 2012-09-03 ENCOUNTER — Encounter (HOSPITAL_COMMUNITY): Payer: Self-pay

## 2012-09-03 MED ORDER — SODIUM CHLORIDE 0.9 % IV SOLN
INTRAVENOUS | Status: DC
  Administered 2012-09-03: 15:00:00 via INTRAVENOUS

## 2012-09-03 NOTE — Procedures (Signed)
Phlebotomized 125 mls after 250 cc of normal saline infused. Patient tolerated phlebotomy.

## 2012-10-01 ENCOUNTER — Encounter (HOSPITAL_COMMUNITY): Payer: Self-pay

## 2012-10-01 ENCOUNTER — Encounter (HOSPITAL_COMMUNITY)
Admission: RE | Admit: 2012-10-01 | Discharge: 2012-10-01 | Disposition: A | Discharge status: Home / Self Care | Payer: Medicare Other | Source: Ambulatory Visit | Attending: Internal Medicine | Admitting: Internal Medicine

## 2012-10-01 MED ORDER — SODIUM CHLORIDE 0.9 % IV SOLN
INTRAVENOUS | Status: DC
  Administered 2012-10-01: 14:00:00 via INTRAVENOUS

## 2012-10-01 NOTE — Procedures (Signed)
Phlebotomize 140m of blood, started at 1450, completed at 1500, pt. Tolerated well.

## 2012-10-25 ENCOUNTER — Other Ambulatory Visit (HOSPITAL_COMMUNITY): Payer: Self-pay | Admitting: Internal Medicine

## 2012-10-29 ENCOUNTER — Encounter (HOSPITAL_COMMUNITY): Payer: Self-pay

## 2012-10-29 ENCOUNTER — Encounter (HOSPITAL_COMMUNITY)
Admission: RE | Admit: 2012-10-29 | Discharge: 2012-10-29 | Disposition: A | Discharge status: Home / Self Care | Payer: Medicare Other | Source: Ambulatory Visit | Attending: Internal Medicine | Admitting: Internal Medicine

## 2012-10-29 MED ORDER — SODIUM CHLORIDE 0.9 % IV SOLN
INTRAVENOUS | Status: AC
  Administered 2012-10-29: 14:00:00 via INTRAVENOUS

## 2012-10-29 NOTE — Procedures (Signed)
Phlebotomized 125 ml of blood. Start at 1523 and end at 1528. Tolerated well. No signs or symptoms noted. Patient stayed 15 minutes for observation. Instructed to call Dr.Kim for problems once discharged.

## 2012-11-01 ENCOUNTER — Encounter (HOSPITAL_COMMUNITY): Payer: Medicare Other

## 2012-11-26 ENCOUNTER — Encounter (HOSPITAL_COMMUNITY): Payer: Medicare Other

## 2012-11-29 ENCOUNTER — Encounter (HOSPITAL_COMMUNITY): Payer: Medicare Other

## 2012-12-03 ENCOUNTER — Encounter (HOSPITAL_COMMUNITY): Payer: Medicare Other

## 2013-01-24 ENCOUNTER — Other Ambulatory Visit (HOSPITAL_COMMUNITY): Payer: Self-pay | Admitting: *Deleted

## 2013-01-27 ENCOUNTER — Encounter (HOSPITAL_COMMUNITY): Payer: Medicare Other

## 2013-01-30 ENCOUNTER — Encounter (HOSPITAL_COMMUNITY): Payer: Self-pay

## 2013-01-30 ENCOUNTER — Encounter (HOSPITAL_COMMUNITY): Payer: Medicare Other

## 2013-01-30 ENCOUNTER — Encounter (HOSPITAL_COMMUNITY)
Admission: RE | Admit: 2013-01-30 | Discharge: 2013-01-30 | Disposition: A | Discharge status: Home / Self Care | Payer: Medicare Other | Source: Ambulatory Visit | Attending: Internal Medicine | Admitting: Internal Medicine

## 2013-01-30 MED ORDER — SODIUM CHLORIDE 0.9 % IV SOLN
Freq: Once | INTRAVENOUS | Status: AC
  Administered 2013-01-30: 08:00:00 via INTRAVENOUS

## 2013-01-30 NOTE — Procedures (Signed)
259m NS infusion given 1 hr prior to Therapeutic Phlebotomy done.  After infusion completed, 2549mBlood drawn out and discarded.  Pt tolerated well.  Reminded pt to continue with fluids today and stay hydrated, pt voiced understanding.

## 2013-01-30 NOTE — Progress Notes (Signed)
Pt states she is going to talk to her MD about her lab work and discuss the need for the therapeutic phlebotomy.  Pt states she will call back for an appointment after talking with her doctor.  Pt knows her ferritin level is WNL and her hgb is also WNL and pt doesn't understand why she needs the therapeutic phlebotomy.

## 2013-02-24 ENCOUNTER — Telehealth: Payer: Self-pay | Admitting: Oncology

## 2013-02-24 NOTE — Telephone Encounter (Signed)
Gave pt appt for lab and MD r/s from April pt was [facility]

## 2013-03-21 ENCOUNTER — Other Ambulatory Visit (HOSPITAL_BASED_OUTPATIENT_CLINIC_OR_DEPARTMENT_OTHER): Payer: Medicare Other | Admitting: Lab

## 2013-03-21 ENCOUNTER — Other Ambulatory Visit: Payer: Self-pay | Admitting: Oncology

## 2013-03-21 LAB — CBC WITH DIFFERENTIAL/PLATELET
Basophils Absolute: 0 10*3/uL (ref 0.0–0.1)
EOS%: 1.3 % (ref 0.0–7.0)
HGB: 13.4 g/dL (ref 11.6–15.9)
LYMPH%: 19.1 % (ref 14.0–49.7)
MCH: 33.4 pg (ref 25.1–34.0)
MCV: 100.5 fL (ref 79.5–101.0)
MONO%: 10.5 % (ref 0.0–14.0)
Platelets: 218 10*3/uL (ref 145–400)
RDW: 12.6 % (ref 11.2–14.5)

## 2013-03-21 LAB — COMPREHENSIVE METABOLIC PANEL (CC13)
AST: 23 U/L (ref 5–34)
Alkaline Phosphatase: 52 U/L (ref 40–150)
BUN: 13.6 mg/dL (ref 7.0–26.0)
Creatinine: 0.8 mg/dL (ref 0.6–1.1)
Potassium: 4.1 mEq/L (ref 3.5–5.1)
Total Bilirubin: 0.45 mg/dL (ref 0.20–1.20)

## 2013-03-28 ENCOUNTER — Ambulatory Visit (HOSPITAL_BASED_OUTPATIENT_CLINIC_OR_DEPARTMENT_OTHER): Payer: Medicare Other | Admitting: Nurse Practitioner

## 2013-03-28 ENCOUNTER — Telehealth: Payer: Self-pay | Admitting: Oncology

## 2013-03-28 DIAGNOSIS — K900 Celiac disease: Secondary | ICD-10-CM

## 2013-03-28 NOTE — Telephone Encounter (Signed)
gv pt appt schedule for November 2014 and February 2015.

## 2013-03-28 NOTE — Progress Notes (Signed)
OFFICE PROGRESS NOTE  Interval history:  Ms. Jimmye Norman is a 67 year old woman who is a homozygote for the C282Y hemachromatosis gene. She was initially seen by Dr. Beryle Beams on 10/05/2010.  To review, she was found to have liver function abnormalities by her primary doctor, Jaynee Eagles. Abdominal ultrasound showed fatty infiltration of the liver. Gene analysis on 05/18/2010 showed that she was homozygous for the C282Y hemachromatosis gene mutation. She underwent a transcutaneous liver biopsy on 09/08/2010 with pathology showing mild chronic portal inflammation associated with macrovesicular steatosis. Minimal portal fibrosis on trichrome stain. Iron stain showed 4+ predominantly periportal intrahepatocyte hemosiderin deposition. PAS stain negative for alpha-1 antitrypsin deposition.  Initial ferritin done in our office on 10/05/2010 returned at 461. She was started on a phlebotomy program which has been monitored by Dr. Maudie Mercury.  She was lost to followup. She returns today to discuss how frequently she needs to undergo phlebotomy. She thinks the most recent phlebotomy schedule was about every 2 months.  Per review of hospital records she most recently underwent phlebotomy on 01/30/2013. Prior to that phlebotomy hospital records indicate phlebotomy procedures on 10/29/2012, 10/01/2012, 09/03/2012, 07/29/2012, 06/19/2012, 05/22/2012, 11/21/2011, 09/28/2011, 08/21/2011, 08/14/2011, 08/07/2011.  She reports requiring IV fluids prior to each phlebotomy.  She overall is feeling well. Since her last visit in 2012 she was diagnosed with reflux and started on Prilosec. She has celiac disease and monitors her diet closely. She denies problems with diarrhea. She has an identical twin sister who she reports is not a homozygote for the C282Y hemachromatosis gene.   Objective: Blood pressure 146/79, pulse 91, temperature 98.1 F (36.7 C), temperature source Oral, resp. rate 19, height 5' 8.5" (1.74 m), weight 145  lb 11.2 oz (66.089 kg).  Oropharynx is without thrush or ulceration. No palpable cervical, supraclavicular or axillary lymph nodes. Lungs are clear. No wheezes or rales. Regular cardiac rhythm. Abdomen is soft and nontender. No organomegaly. Extremities are without edema.  Lab Results: Lab Results  Component Value Date   WBC 7.0 03/21/2013   HGB 13.4 03/21/2013   HCT 40.3 03/21/2013   MCV 100.5 03/21/2013   PLT 218 03/21/2013    Chemistry:    Chemistry      Component Value Date/Time   NA 140 03/21/2013 1135   NA 142 08/23/2011 1835   K 4.1 03/21/2013 1135   K 3.5 08/23/2011 1835   CL 105 08/23/2011 1835   CO2 24 03/21/2013 1135   CO2 26 08/23/2011 1835   BUN 13.6 03/21/2013 1135   BUN 8 08/23/2011 1835   CREATININE 0.8 03/21/2013 1135   CREATININE 0.64 08/23/2011 1835      Component Value Date/Time   CALCIUM 9.1 03/21/2013 1135   CALCIUM 10.2 08/23/2011 1835   ALKPHOS 52 03/21/2013 1135   ALKPHOS 44 10/05/2010 1141   AST 23 03/21/2013 1135   AST 31 10/05/2010 1141   ALT 35 03/21/2013 1135   ALT 47* 10/05/2010 1141   BILITOT 0.45 03/21/2013 1135   BILITOT 0.4 10/05/2010 1141     03/21/2013 ferritin 35  Studies/Results: No results found.  Medications: I have reviewed the patient's current medications.  Assessment/Plan:  1. Homozygote C282Y hemachromatosis gene. 2. Celiac disease.  Disposition-current ferritin is in the goal range (less than 150). She understand she will need to be on an intermittent phlebotomy program. Based on serial labs we will try to determine the best interval for the phlebotomies. She will return for labs in approximately 3 months with a possible phlebotomy  at that time. We will schedule a 6 month office visit. She will contact the office in the interim with any problems.  Plan reviewed with Dr. Beryle Beams.   CC Dr. Jani Gravel.  Ned Card ANP/GNP-BC

## 2013-06-10 ENCOUNTER — Other Ambulatory Visit (HOSPITAL_BASED_OUTPATIENT_CLINIC_OR_DEPARTMENT_OTHER): Payer: Medicare Other

## 2013-06-10 LAB — CBC WITH DIFFERENTIAL/PLATELET
Basophils Absolute: 0 10*3/uL (ref 0.0–0.1)
Eosinophils Absolute: 0.1 10*3/uL (ref 0.0–0.5)
HCT: 41.9 % (ref 34.8–46.6)
HGB: 14 g/dL (ref 11.6–15.9)
LYMPH%: 36.6 % (ref 14.0–49.7)
MCV: 100.1 fL (ref 79.5–101.0)
MONO#: 0.5 10*3/uL (ref 0.1–0.9)
MONO%: 10.8 % (ref 0.0–14.0)
NEUT#: 2.4 10*3/uL (ref 1.5–6.5)
Platelets: 238 10*3/uL (ref 145–400)
WBC: 4.8 10*3/uL (ref 3.9–10.3)

## 2013-06-23 ENCOUNTER — Telehealth: Payer: Self-pay | Admitting: *Deleted

## 2013-06-23 NOTE — Telephone Encounter (Signed)
Received call from pt asking for result of ferritin.  Message given per Dr Azucena Freed instructions.  She does not have any phlebotomies scheduled.  Return appt in Feb with labs.

## 2013-06-23 NOTE — Telephone Encounter (Signed)
Message copied by Jesse Fall on Mon Jun 23, 2013  4:04 PM ------      Message from: Ignacia Felling      Created: Fri Jun 20, 2013  5:42 PM                   ----- Message -----         From: Annia Belt, MD         Sent: 06/15/2013   3:14 PM           To: Ignacia Felling, RN, Amy Denyse Amass, RN            Call pt  Ferritin low at 24 - this is good - continue intermittent phlebotomies ------

## 2013-07-11 ENCOUNTER — Telehealth: Payer: Self-pay | Admitting: Oncology

## 2013-07-11 NOTE — Telephone Encounter (Signed)
Faxed pt labs to Dr. Rudean Haskell pt

## 2013-08-13 ENCOUNTER — Other Ambulatory Visit: Payer: Self-pay | Admitting: Oncology

## 2013-09-24 ENCOUNTER — Other Ambulatory Visit (HOSPITAL_BASED_OUTPATIENT_CLINIC_OR_DEPARTMENT_OTHER): Payer: Medicare Other

## 2013-09-24 LAB — COMPREHENSIVE METABOLIC PANEL (CC13)
ALBUMIN: 4.4 g/dL (ref 3.5–5.0)
ALK PHOS: 46 U/L (ref 40–150)
ALT: 58 U/L — ABNORMAL HIGH (ref 0–55)
AST: 35 U/L — AB (ref 5–34)
Anion Gap: 9 mEq/L (ref 3–11)
BUN: 17 mg/dL (ref 7.0–26.0)
CO2: 30 mEq/L — ABNORMAL HIGH (ref 22–29)
Calcium: 9.9 mg/dL (ref 8.4–10.4)
Chloride: 102 mEq/L (ref 98–109)
Creatinine: 0.9 mg/dL (ref 0.6–1.1)
GLUCOSE: 117 mg/dL (ref 70–140)
POTASSIUM: 4.7 meq/L (ref 3.5–5.1)
SODIUM: 140 meq/L (ref 136–145)
TOTAL PROTEIN: 7.9 g/dL (ref 6.4–8.3)
Total Bilirubin: 0.54 mg/dL (ref 0.20–1.20)

## 2013-09-24 LAB — CBC WITH DIFFERENTIAL/PLATELET
BASO%: 0.4 % (ref 0.0–2.0)
Basophils Absolute: 0 10*3/uL (ref 0.0–0.1)
EOS ABS: 0.1 10*3/uL (ref 0.0–0.5)
EOS%: 2 % (ref 0.0–7.0)
HCT: 42.7 % (ref 34.8–46.6)
HGB: 14.7 g/dL (ref 11.6–15.9)
LYMPH%: 30.1 % (ref 14.0–49.7)
MCH: 34.3 pg — ABNORMAL HIGH (ref 25.1–34.0)
MCHC: 34.4 g/dL (ref 31.5–36.0)
MCV: 99.7 fL (ref 79.5–101.0)
MONO#: 0.7 10*3/uL (ref 0.1–0.9)
MONO%: 10 % (ref 0.0–14.0)
NEUT%: 57.5 % (ref 38.4–76.8)
NEUTROS ABS: 3.9 10*3/uL (ref 1.5–6.5)
Platelets: 246 10*3/uL (ref 145–400)
RBC: 4.28 10*6/uL (ref 3.70–5.45)
RDW: 12.7 % (ref 11.2–14.5)
WBC: 6.8 10*3/uL (ref 3.9–10.3)
lymph#: 2 10*3/uL (ref 0.9–3.3)

## 2013-09-24 LAB — FERRITIN CHCC: FERRITIN: 28 ng/mL (ref 9–269)

## 2013-09-26 ENCOUNTER — Ambulatory Visit (HOSPITAL_BASED_OUTPATIENT_CLINIC_OR_DEPARTMENT_OTHER): Payer: Medicare Other | Admitting: Oncology

## 2013-09-26 NOTE — Progress Notes (Signed)
Followup visit for this 68 year old woman who is a homozygote for the  C282Y hemachromatosis gene. Highest recorded ferritin in our office 461 on  10/05/2010. She is on an intermittent phlebotomy program. She had a rapid fall in her ferritin as low as 24 by 06/10/2013. Most recent value 28 on 09/24/2013. Liver functions have been normal except for mild transient elevations of SGOT with value  58 today with lab normal up to 55. She does not tolerate the volume changes with full phlebotomy. She has had only 250 mL's phlebotomized at one time with concomitant saline fluid replacement. She has been getting procedures done at [facility] long short stay.  Of interest is the fact that she has an identical twin sister. They both have celiac disease but her sister did test negative for the hemachromatosis gene.  She's had no interim medical problems.  Physical exam not done today.  Impression  #1. Homozygote status for the hemachromatosis gene Ferritin is  so low that it is appropriate just to monitor periodic ferritins and reinitiate phlebotomy on an as needed basis if the ferritin begins to rise above 150. I propose that we check laboratory 3 months. She would like to continue to follow me at my hematology clinic at [facility].  #2. Adult celiac disease  100% of this visit spent with direct face-to-face contact with the patient in coordination of care.

## 2013-09-30 ENCOUNTER — Telehealth: Payer: Self-pay | Admitting: *Deleted

## 2013-09-30 NOTE — Telephone Encounter (Signed)
i sent and e-mail to MD and [facility] making them aware that the pt need a 4 month appt  w/ G...td

## 2013-10-07 ENCOUNTER — Ambulatory Visit (INDEPENDENT_AMBULATORY_CARE_PROVIDER_SITE_OTHER): Payer: Medicare Other | Admitting: Gynecology

## 2013-10-07 ENCOUNTER — Encounter: Payer: Self-pay | Admitting: Gynecology

## 2013-10-07 VITALS — BP 120/70 | Ht 68.0 in | Wt 147.0 lb

## 2013-10-07 DIAGNOSIS — N8111 Cystocele, midline: Secondary | ICD-10-CM

## 2013-10-07 DIAGNOSIS — N952 Postmenopausal atrophic vaginitis: Secondary | ICD-10-CM

## 2013-10-07 MED ORDER — ESTROGENS, CONJUGATED 0.625 MG/GM VA CREA
1.0000 | TOPICAL_CREAM | VAGINAL | Status: DC
Start: 2013-10-07 — End: 2018-12-16

## 2013-10-07 NOTE — Patient Instructions (Signed)
Office will call you to arrange bone density. Followup in one year, sooner if any issues.

## 2013-10-07 NOTE — Progress Notes (Signed)
Jessica Conley [birth year] [id number]        68 y.o.  G3P3 new patient for followup exam.  Several issues noted below.  Past medical history,surgical history, problem list, medications, allergies, family history and social history were all reviewed and documented in the EPIC chart.  ROS:  Performed and pertinent positives and negatives are included in the history, assessment and plan .  Exam: Kim assistant Filed Vitals:   10/07/13 1029  BP: 120/70  Height: 5' 8"  (1.727 m)  Weight: 147 lb (66.679 kg)   General appearance  Normal Skin grossly normal Head/Neck normal with no cervical or supraclavicular adenopathy thyroid normal Lungs  clear Cardiac RR, without RMG Abdominal  soft, nontender, without masses, organomegaly or hernia Breasts  examined lying and sitting without masses, retractions, discharge or axillary adenopathy. Pelvic  Ext/BUS/vagina with atrophic changes. First degree cystocele. Cuff well supported. No rectocele.  Adnexa  Without masses or tenderness    Anus and perineum  Normal   Rectovaginal  Normal sphincter tone without palpated masses or tenderness.    Assessment/Plan:  [name] for annual exam.   1. Postmenopausal/status post TVH for prolapse a number of years ago. Doing well. Has mild cystocele and is asymptomatic. Will observe at this time.  Uses Premarin vaginal cream occasionally. I discussed whether this is really a benefit to her but she feels it does help with vaginal dryness using it once or twice monthly. The issues of vaginal estrogen supplementation and absorption with possible side effects such as thrombosis, stroke heart attack DVT and breast cancer issues all discussed. Patient's comfortable continuing and I refilled her times year. 2. Pap smear 3 years ago. No Pap smear done today. No history of abnormal Pap smears historically. Discussed current screening guidelines we're both comfortable stop screening and she status post  hysterectomy for benign indications and over the age of 68. 3. Mammography reported this past fall. Continue with annual mammography. SBE monthly review. 4. DEXA several years ago. We'll go ahead and schedule baseline now. Increase calcium vitamin D reviewed. 5. Colonoscopy 2014. Repeat at their recommended interval. 6. Health maintenance. No blood work done as this is all done through her primary physician's office. Followup one year, sooner as needed.   Note: This document was prepared with digital dictation and possible smart phrase technology. Any transcriptional errors that result from this process are unintentional.   [name] MD, 11:24 AM 10/07/2013

## 2013-10-08 LAB — URINALYSIS W MICROSCOPIC + REFLEX CULTURE
BACTERIA UA: NONE SEEN
BILIRUBIN URINE: NEGATIVE
CASTS: NONE SEEN
Crystals: NONE SEEN
GLUCOSE, UA: NEGATIVE mg/dL
HGB URINE DIPSTICK: NEGATIVE
KETONES UR: NEGATIVE mg/dL
Nitrite: NEGATIVE
PH: 7.5 (ref 5.0–8.0)
Protein, ur: NEGATIVE mg/dL
Specific Gravity, Urine: 1.02 (ref 1.005–1.030)
Urobilinogen, UA: 0.2 mg/dL (ref 0.0–1.0)

## 2013-10-09 ENCOUNTER — Other Ambulatory Visit: Payer: Self-pay | Admitting: Gynecology

## 2013-10-09 LAB — URINE CULTURE

## 2013-10-09 MED ORDER — SULFAMETHOXAZOLE-TMP DS 800-160 MG PO TABS: 1.0000 | ORAL_TABLET | Freq: Two times a day (BID) | ORAL | Status: DC

## 2014-03-04 ENCOUNTER — Other Ambulatory Visit: Payer: Self-pay | Admitting: Oncology

## 2014-04-07 ENCOUNTER — Other Ambulatory Visit: Payer: Medicare Other

## 2014-04-07 LAB — COMPREHENSIVE METABOLIC PANEL
ALT: 53 U/L — AB (ref 0–35)
AST: 35 U/L (ref 0–37)
Albumin: 4.6 g/dL (ref 3.5–5.2)
Alkaline Phosphatase: 37 U/L — ABNORMAL LOW (ref 39–117)
BILIRUBIN TOTAL: 0.6 mg/dL (ref 0.2–1.2)
BUN: 15 mg/dL (ref 6–23)
CO2: 28 meq/L (ref 19–32)
CREATININE: 0.75 mg/dL (ref 0.50–1.10)
Calcium: 9.6 mg/dL (ref 8.4–10.5)
Chloride: 102 mEq/L (ref 96–112)
Glucose, Bld: 123 mg/dL — ABNORMAL HIGH (ref 70–99)
Potassium: 4.3 mEq/L (ref 3.5–5.3)
Sodium: 137 mEq/L (ref 135–145)
Total Protein: 7.5 g/dL (ref 6.0–8.3)

## 2014-04-07 LAB — CBC WITH DIFFERENTIAL/PLATELET
BASOS PCT: 1 % (ref 0–1)
Basophils Absolute: 0 10*3/uL (ref 0.0–0.1)
EOS ABS: 0.2 10*3/uL (ref 0.0–0.7)
EOS PCT: 5 % (ref 0–5)
HCT: 41.5 % (ref 36.0–46.0)
Hemoglobin: 14.3 g/dL (ref 12.0–15.0)
Lymphocytes Relative: 34 % (ref 12–46)
Lymphs Abs: 1.6 10*3/uL (ref 0.7–4.0)
MCH: 33.1 pg (ref 26.0–34.0)
MCHC: 34.5 g/dL (ref 30.0–36.0)
MCV: 96.1 fL (ref 78.0–100.0)
MONOS PCT: 10 % (ref 3–12)
Monocytes Absolute: 0.5 10*3/uL (ref 0.1–1.0)
NEUTROS PCT: 50 % (ref 43–77)
Neutro Abs: 2.4 10*3/uL (ref 1.7–7.7)
PLATELETS: 266 10*3/uL (ref 150–400)
RBC: 4.32 MIL/uL (ref 3.87–5.11)
RDW: 13 % (ref 11.5–15.5)
WBC: 4.8 10*3/uL (ref 4.0–10.5)

## 2014-04-07 LAB — FERRITIN: FERRITIN: 24 ng/mL (ref 10–291)

## 2014-04-10 ENCOUNTER — Telehealth: Payer: Self-pay | Admitting: *Deleted

## 2014-04-10 NOTE — Telephone Encounter (Signed)
Message copied by Ebbie Latus on Fri Apr 10, 2014 10:17 AM ------      Message from: Annia Belt      Created: Wed Apr 08, 2014 10:01 AM       Call pt: ferritin remains low at 24.  No need to do anything at this time ------

## 2014-04-10 NOTE — Telephone Encounter (Signed)
Pt called / informed her Ferritin is low @ 24 and no need to do anything at this time per Dr Beryle Beams. Informed to keep her appt on Monday.

## 2014-04-13 ENCOUNTER — Encounter: Payer: Self-pay | Admitting: Oncology

## 2014-04-13 ENCOUNTER — Ambulatory Visit (INDEPENDENT_AMBULATORY_CARE_PROVIDER_SITE_OTHER): Payer: Medicare Other | Admitting: Oncology

## 2014-04-13 NOTE — Patient Instructions (Signed)
Change lab to every 6 months MD visit with Dr Darnell Level in 1 year

## 2014-04-15 NOTE — Progress Notes (Signed)
Patient ID: Jessica Conley, female   DOB: October 30, 1945, 68 y.o.   MRN: [id number] Hematology and Oncology Follow Up Visit  ANISAH KUCK [id number] October 04, 1945 68 y.o. 04/15/2014 11:33 AM   Principle Diagnosis: Encounter Diagnosis  Name Primary?  . Hemochromatosis, hereditary Yes     Interim History:   Followup visit for this pleasant 68 year old woman who is a homozygote for the C282Y hemochromatosis gene. Peak ferritin  never higher than 461 at time of diagnosis in March of 2012. She was initially started on a phlebotomy program which she tolerated poorly but which was successful and rapidly reducing her ferritin into the normal range. Over time and without additional phlebotomy, her ferritin levels have actually continued to decrease.  Current value on 04/07/2014 is 24. She has mild, chronic, elevation of SGPT liver enzyme with normal SGOT and bilirubin and a low alkaline phosphatase level. Ultrasound of the abdomen done September 2009 showed probable mild diffuse fatty infiltration of the liver without any focal abnormalities. She has no obvious signs of blood loss. No hematochezia or melena. No hematuria.  She is doing well. She's had no interim medical problems.  Allergies:  Allergies  Allergen Reactions  . Wheat     ciliac disease  . Penicillins Rash    Waterblisters     Review of Systems: Hematology:  No bleeding or bruising ENT ROS: No sore throat Breast ROS: No breast lumps Respiratory ROS: No cough or dyspnea Cardiovascular ROS:  No ischemic type chest pain or palpitations Gastrointestinal ROS: No abdominal pain or change in bowel habit   Genito-Urinary ROS:  Musculoskeletal ROS: No muscle bone or joint pain Neurological ROS: No headache or change in vision Dermatological ROS: No rash Remaining ROS negative:   Physical Exam: Blood pressure 143/84, pulse 82, temperature 97.7 F (36.5 C), temperature source Oral, height 5' 8"  (1.727 m), weight 150 lb 8  oz (68.266 kg), SpO2 98.00%. Wt Readings from Last 3 Encounters:  04/13/14 150 lb 8 oz (68.266 kg)  10/07/13 147 lb (66.679 kg)  09/26/13 151 lb (68.493 kg)     General appearance: Thin but adequately nourished Caucasian woman HENNT: Pharynx no erythema, exudate, mass, or ulcer. No thyromegaly or thyroid nodules Lymph nodes: No cervical, supraclavicular, or axillary lymphadenopathy Breasts:  Lungs: Clear to auscultation, resonant to percussion throughout Heart: Regular rhythm, no murmur, no gallop, no rub, no click, no edema Abdomen: Soft, nontender, normal bowel sounds, no mass, no organomegaly Extremities: No edema, no calf tenderness Musculoskeletal: no joint deformities GU:  Vascular: Carotid pulses 2+, no bruits,  Neurologic: Alert, oriented, PERRLA,, cranial nerves grossly normal, motor strength 5 over 5, reflexes 1+ symmetric, upper body coordination normal, gait normal, Skin: No rash or ecchymosis  Lab Results: CBC W/Diff    Component Value Date/Time   WBC 4.8 04/07/2014 1028   WBC 6.8 09/24/2013 0903   RBC 4.32 04/07/2014 1028   RBC 4.28 09/24/2013 0903   HGB 14.3 04/07/2014 1028   HGB 14.7 09/24/2013 0903   HCT 41.5 04/07/2014 1028   HCT 42.7 09/24/2013 0903   PLT 266 04/07/2014 1028   PLT 246 09/24/2013 0903   MCV 96.1 04/07/2014 1028   MCV 99.7 09/24/2013 0903   MCH 33.1 04/07/2014 1028   MCH 34.3* 09/24/2013 0903   MCHC 34.5 04/07/2014 1028   MCHC 34.4 09/24/2013 0903   RDW 13.0 04/07/2014 1028   RDW 12.7 09/24/2013 0903   LYMPHSABS 1.6 04/07/2014 1028   LYMPHSABS 2.0 09/24/2013 8891  MONOABS 0.5 04/07/2014 1028   MONOABS 0.7 09/24/2013 0903   EOSABS 0.2 04/07/2014 1028   EOSABS 0.1 09/24/2013 0903   BASOSABS 0.0 04/07/2014 1028   BASOSABS 0.0 09/24/2013 0903     Chemistry      Component Value Date/Time   NA 137 04/07/2014 1028   NA 140 09/24/2013 0903   K 4.3 04/07/2014 1028   K 4.7 09/24/2013 0903   CL 102 04/07/2014 1028   CO2 28 04/07/2014 1028   CO2 30* 09/24/2013 0903   BUN 15  04/07/2014 1028   BUN 17.0 09/24/2013 0903   CREATININE 0.75 04/07/2014 1028   CREATININE 0.9 09/24/2013 0903   CREATININE 0.64 08/23/2011 1835      Component Value Date/Time   CALCIUM 9.6 04/07/2014 1028   CALCIUM 9.9 09/24/2013 0903   ALKPHOS 37* 04/07/2014 1028   ALKPHOS 46 09/24/2013 0903   AST 35 04/07/2014 1028   AST 35* 09/24/2013 0903   ALT 53* 04/07/2014 1028   ALT 58* 09/24/2013 0903   BILITOT 0.6 04/07/2014 1028   BILITOT 0.54 09/24/2013 0903      Impression:  #1. Homozygote status for hemachromatosis gene. Consistently low ferritin levels without need for ongoing phlebotomy. I'm going to decrease frequency of lab monitoring to every 6 months.  #2. Adult celiac disease Not active on gluten-free diet.    CC: Patient Care Team: Jani Gravel, MD as PCP - General (Internal Medicine)   Annia Belt, MD 9/16/201511:33 AM

## 2014-06-01 ENCOUNTER — Encounter: Payer: Self-pay | Admitting: Oncology

## 2014-10-12 ENCOUNTER — Other Ambulatory Visit (INDEPENDENT_AMBULATORY_CARE_PROVIDER_SITE_OTHER): Payer: Medicare Other

## 2014-10-12 LAB — COMPREHENSIVE METABOLIC PANEL
ALT: 41 U/L — ABNORMAL HIGH (ref 0–35)
AST: 27 U/L (ref 0–37)
Albumin: 3.9 g/dL (ref 3.5–5.2)
Alkaline Phosphatase: 37 U/L — ABNORMAL LOW (ref 39–117)
BUN: 15 mg/dL (ref 6–23)
CALCIUM: 9.1 mg/dL (ref 8.4–10.5)
CHLORIDE: 101 meq/L (ref 96–112)
CO2: 25 meq/L (ref 19–32)
CREATININE: 0.71 mg/dL (ref 0.50–1.10)
Glucose, Bld: 91 mg/dL (ref 70–99)
Potassium: 4.1 mEq/L (ref 3.5–5.3)
Sodium: 139 mEq/L (ref 135–145)
Total Bilirubin: 0.6 mg/dL (ref 0.2–1.2)
Total Protein: 6.7 g/dL (ref 6.0–8.3)

## 2014-10-12 LAB — CBC WITH DIFFERENTIAL/PLATELET
Basophils Absolute: 0.1 10*3/uL (ref 0.0–0.1)
Basophils Relative: 1 % (ref 0–1)
Eosinophils Absolute: 0.1 10*3/uL (ref 0.0–0.7)
Eosinophils Relative: 2 % (ref 0–5)
HEMATOCRIT: 42.3 % (ref 36.0–46.0)
Hemoglobin: 14.5 g/dL (ref 12.0–15.0)
LYMPHS ABS: 1.9 10*3/uL (ref 0.7–4.0)
LYMPHS PCT: 37 % (ref 12–46)
MCH: 34.6 pg — ABNORMAL HIGH (ref 26.0–34.0)
MCHC: 34.3 g/dL (ref 30.0–36.0)
MCV: 101 fL — AB (ref 78.0–100.0)
MONO ABS: 0.6 10*3/uL (ref 0.1–1.0)
MONOS PCT: 11 % (ref 3–12)
MPV: 10.7 fL (ref 8.6–12.4)
NEUTROS ABS: 2.5 10*3/uL (ref 1.7–7.7)
Neutrophils Relative %: 49 % (ref 43–77)
PLATELETS: 252 10*3/uL (ref 150–400)
RBC: 4.19 MIL/uL (ref 3.87–5.11)
RDW: 13 % (ref 11.5–15.5)
WBC: 5.2 10*3/uL (ref 4.0–10.5)

## 2014-10-12 LAB — FERRITIN: FERRITIN: 54 ng/mL (ref 10–291)

## 2014-10-15 ENCOUNTER — Telehealth: Payer: Self-pay | Admitting: *Deleted

## 2014-10-15 NOTE — Telephone Encounter (Signed)
-----   Message from Annia Belt, MD sent at 10/13/2014  2:43 PM EDT ----- Call pt: hb normal @ 14.5; iron studies normal

## 2014-10-15 NOTE — Telephone Encounter (Signed)
Pt called / informed Hgb normal @ 14.5 and iron studies are normal per Dr Beryle Beams. Pt asked what's her ferritin level which is 54; up from 24 - 6 months ago.  She asked about her next appt; her next appt w/Dr Gr will be mailed to her.

## 2015-04-14 ENCOUNTER — Other Ambulatory Visit (INDEPENDENT_AMBULATORY_CARE_PROVIDER_SITE_OTHER): Payer: Medicare Other

## 2015-04-15 ENCOUNTER — Other Ambulatory Visit: Payer: Medicare Other

## 2015-04-15 LAB — CBC WITH DIFFERENTIAL/PLATELET
BASOS: 1 %
Basophils Absolute: 0 10*3/uL (ref 0.0–0.2)
EOS (ABSOLUTE): 0.1 10*3/uL (ref 0.0–0.4)
EOS: 2 %
HEMATOCRIT: 40.9 % (ref 34.0–46.6)
HEMOGLOBIN: 14 g/dL (ref 11.1–15.9)
IMMATURE GRANS (ABS): 0 10*3/uL (ref 0.0–0.1)
IMMATURE GRANULOCYTES: 0 %
LYMPHS: 38 %
Lymphocytes Absolute: 1.8 10*3/uL (ref 0.7–3.1)
MCH: 34.7 pg — ABNORMAL HIGH (ref 26.6–33.0)
MCHC: 34.2 g/dL (ref 31.5–35.7)
MCV: 101 fL — AB (ref 79–97)
MONOCYTES: 8 %
MONOS ABS: 0.4 10*3/uL (ref 0.1–0.9)
NEUTROS PCT: 51 %
Neutrophils Absolute: 2.4 10*3/uL (ref 1.4–7.0)
Platelets: 236 10*3/uL (ref 150–379)
RBC: 4.04 x10E6/uL (ref 3.77–5.28)
RDW: 13.3 % (ref 12.3–15.4)
WBC: 4.7 10*3/uL (ref 3.4–10.8)

## 2015-04-15 LAB — CMP14 + ANION GAP
ALBUMIN: 4.4 g/dL (ref 3.5–4.8)
ALK PHOS: 38 IU/L — AB (ref 39–117)
ALT: 46 IU/L — AB (ref 0–32)
ANION GAP: 16 mmol/L (ref 10.0–18.0)
AST: 32 IU/L (ref 0–40)
Albumin/Globulin Ratio: 1.6 (ref 1.1–2.5)
BUN / CREAT RATIO: 18 (ref 11–26)
BUN: 13 mg/dL (ref 8–27)
Bilirubin Total: 0.5 mg/dL (ref 0.0–1.2)
CALCIUM: 9.2 mg/dL (ref 8.7–10.3)
CO2: 25 mmol/L (ref 18–29)
CREATININE: 0.72 mg/dL (ref 0.57–1.00)
Chloride: 97 mmol/L (ref 97–108)
GFR calc Af Amer: 97 mL/min/{1.73_m2} (ref >59)
GFR, EST NON AFRICAN AMERICAN: 85 mL/min/{1.73_m2} (ref >59)
GLOBULIN, TOTAL: 2.7 g/dL (ref 1.5–4.5)
Glucose: 118 mg/dL — ABNORMAL HIGH (ref 65–99)
Potassium: 4.4 mmol/L (ref 3.5–5.2)
SODIUM: 138 mmol/L (ref 134–144)
TOTAL PROTEIN: 7.1 g/dL (ref 6.0–8.5)

## 2015-04-15 LAB — FERRITIN: FERRITIN: 155 ng/mL — AB (ref 15–150)

## 2015-04-27 ENCOUNTER — Telehealth: Payer: Self-pay | Admitting: *Deleted

## 2015-04-27 ENCOUNTER — Encounter: Payer: Self-pay | Admitting: Oncology

## 2015-04-27 ENCOUNTER — Ambulatory Visit (INDEPENDENT_AMBULATORY_CARE_PROVIDER_SITE_OTHER): Payer: Medicare Other | Admitting: Oncology

## 2015-04-27 DIAGNOSIS — K900 Celiac disease: Secondary | ICD-10-CM

## 2015-04-27 DIAGNOSIS — K7581 Nonalcoholic steatohepatitis (NASH): Secondary | ICD-10-CM | POA: Diagnosis not present

## 2015-04-27 NOTE — Patient Instructions (Signed)
Resume phlebotomy at [facility] short stay unit every 4 months: next on 05/17/15 Schedule ultrasound of liver, lab same day at [facility] for 07/27/15 Follow up with Dr Darnell Level in January, 2017

## 2015-04-27 NOTE — Telephone Encounter (Signed)
Pt called /informed of appt for phlebotomy on 05/20/15 @ 0900AM here at [facility]; need to register at Admissions prior to appt. Pt voiced understanding.

## 2015-04-28 NOTE — Progress Notes (Signed)
Patient ID: Jessica Conley, female   DOB: 07/02/1946, 69 y.o.   MRN: [id number] Hematology and Oncology Follow Up Visit  Jessica Conley [id number] [birth year] 69 y.o. 04/28/2015 5:21 PM   Principle Diagnosis: Encounter Diagnosis  Name Primary?  . Hemochromatosis, hereditary Yes  Clinical Summary: Pleasant 69 year old woman who is a homozygote for the C282Y hemochromatosis gene. Peak ferritin never higher than 461 at time of diagnosis in March of 2012. She had a liver biopsy on 09/09/2010. 4+ iron. Mild periportal inflammation consistent with underlying steatosis. She was initially started on a phlebotomy program which she tolerated poorly but which was successful in rapidly reducing her ferritin into the normal range. Over time and without additional phlebotomy, her ferritin levels have continued to decrease.  She has mild, chronic, elevation of SGPT liver enzyme with normal SGOT and bilirubin and a low alkaline phosphatase level. Ultrasound of the abdomen done September 2009 showed probable mild diffuse fatty infiltration of the liver without any focal abnormalities.  Interim History:  She is doing well. No interim medical problems since visit with me last year. She is up-to-date with her health maintenance exams. She saw her gynecologist in March 2013. She is status post vaginal hysterectomy. Pap smear is being done every 3 years. Last colonoscopy 2 years ago in 2014. Lab was done in anticipation of today's visit. Ferritin level starting to rise again: 155 on 04/14/2015 compared with 54 on 10/12/2014. No significant change in her liver function. Persistent mild elevation of SGOT at 46 units reference range up to 32. Value was 41 and March and 53 in September 2015. Bilirubin normal.  Medications: reviewed  Allergies:  Allergies  Allergen Reactions  . Wheat     ciliac disease  . Penicillins Rash    Waterblisters     Review of Systems: See history of present  illness Remaining ROS negative:   Physical Exam: Blood pressure 135/84, pulse 86, temperature 98.4 F (36.9 C), temperature source Oral, height 5' 8"  (1.727 m), weight 151 lb (68.493 kg), SpO2 99 %. Wt Readings from Last 3 Encounters:  04/27/15 151 lb (68.493 kg)  04/13/14 150 lb 8 oz (68.266 kg)  10/07/13 147 lb (66.679 kg)     General appearance: Well-nourished Caucasian woman HENNT: Pharynx no erythema, exudate, mass, or ulcer. No thyromegaly or thyroid nodules Lymph nodes: No cervical, supraclavicular, or axillary lymphadenopathy Breasts:  Lungs: Clear to auscultation, resonant to percussion throughout Heart: Regular rhythm, no murmur, no gallop, no rub, no click, no edema Abdomen: Soft, nontender, normal bowel sounds, no mass, no organomegaly Extremities: No edema, no calf tenderness Musculoskeletal: no joint deformities GU:  Vascular: Carotid pulses 2+, no bruits,  Neurologic: Alert, oriented, PERRLA, cranial nerves grossly normal, motor strength 5 over 5, reflexes 1+ symmetric, upper body coordination normal, gait normal, Skin: No rash or ecchymosis  Lab Results: CBC W/Diff    Component Value Date/Time   WBC 4.7 04/14/2015 0939   WBC 5.2 10/12/2014 0946   WBC 6.8 09/24/2013 0903   RBC 4.04 04/14/2015 0939   RBC 4.19 10/12/2014 0946   RBC 4.28 09/24/2013 0903   HGB 14.5 10/12/2014 0946   HGB 14.7 09/24/2013 0903   HCT 40.9 04/14/2015 0939   HCT 42.3 10/12/2014 0946   HCT 42.7 09/24/2013 0903   PLT 252 10/12/2014 0946   PLT 246 09/24/2013 0903   MCV 101.0* 10/12/2014 0946   MCV 99.7 09/24/2013 0903   MCH 34.7* 04/14/2015 0939   MCH 34.6* 10/12/2014 0946  MCH 34.3* 09/24/2013 0903   MCHC 34.2 04/14/2015 0939   MCHC 34.3 10/12/2014 0946   MCHC 34.4 09/24/2013 0903   RDW 13.3 04/14/2015 0939   RDW 13.0 10/12/2014 0946   RDW 12.7 09/24/2013 0903   LYMPHSABS 1.8 04/14/2015 0939   LYMPHSABS 1.9 10/12/2014 0946   LYMPHSABS 2.0 09/24/2013 0903   MONOABS 0.6  10/12/2014 0946   MONOABS 0.7 09/24/2013 0903   EOSABS 0.1 10/12/2014 0946   EOSABS 0.1 09/24/2013 0903   BASOSABS 0.0 04/14/2015 0939   BASOSABS 0.1 10/12/2014 0946   BASOSABS 0.0 09/24/2013 0903     Chemistry      Component Value Date/Time   NA 138 04/14/2015 0939   NA 139 10/12/2014 0946   NA 140 09/24/2013 0903   K 4.4 04/14/2015 0939   K 4.7 09/24/2013 0903   CL 97 04/14/2015 0939   CO2 25 04/14/2015 0939   CO2 30* 09/24/2013 0903   BUN 13 04/14/2015 0939   BUN 15 10/12/2014 0946   BUN 17.0 09/24/2013 0903   CREATININE 0.72 04/14/2015 0939   CREATININE 0.71 10/12/2014 0946   CREATININE 0.9 09/24/2013 0903      Component Value Date/Time   CALCIUM 9.2 04/14/2015 0939   CALCIUM 9.9 09/24/2013 0903   ALKPHOS 38* 04/14/2015 0939   ALKPHOS 46 09/24/2013 0903   AST 32 04/14/2015 0939   AST 35* 09/24/2013 0903   ALT 46* 04/14/2015 0939   ALT 58* 09/24/2013 0903   BILITOT 0.5 04/14/2015 0939   BILITOT 0.6 10/12/2014 0946   BILITOT 0.54 09/24/2013 2595       Radiological Studies: No results found.  Impression:  #1. Homozygote status for hemachromatosis gene. Ferritin now rising again. I'm going to put her back on a phlebotomy program 250-500 mL's. Every 3 months 3 then reevaluate. She had some problems with phlebotomy in the past. I recommended that she hold her diuretic the day of the procedure. If she cannot tolerate a full phlebotomy then we will modify to partial phlebotomy.  #2. Adult celiac disease Not active on gluten-free diet.  #3. NASH - mild    CC: Patient Care Team: Jani Gravel, MD as PCP - General (Internal Medicine)   Annia Belt, MD 9/28/20165:21 PM

## 2015-04-29 ENCOUNTER — Telehealth: Payer: Self-pay | Admitting: *Deleted

## 2015-04-29 NOTE — Telephone Encounter (Signed)
Pt called about Abd U/S appt - no answer; left message appt is 12/27 @ 1100AM here at [facility] radiology and needs to come to [facility] lab @ 0945AM and NPO x 6 hrs prior to test. And to call if she has any questions.

## 2015-05-19 ENCOUNTER — Other Ambulatory Visit (HOSPITAL_COMMUNITY): Payer: Self-pay | Admitting: *Deleted

## 2015-05-20 ENCOUNTER — Ambulatory Visit (HOSPITAL_COMMUNITY)
Admission: RE | Admit: 2015-05-20 | Discharge: 2015-05-20 | Disposition: A | Discharge status: Home / Self Care | Payer: Medicare Other | Source: Ambulatory Visit | Attending: Oncology | Admitting: Oncology

## 2015-05-20 MED ORDER — SODIUM CHLORIDE 0.9 % IV SOLN
INTRAVENOUS | Status: DC
  Administered 2015-05-20: 09:00:00 via INTRAVENOUS

## 2015-05-20 NOTE — Progress Notes (Signed)
Pt came in today for scheduled phlebotomy.  Right AC was used.  At 200 cc pt stated that she felt weak and asked if she was pale.  She then passed out for about 15 seconds.  Pt's vital signs at 0900 were 79/44 pulse 56 97% RA.  At 905 the BP was 72/34.  I called Dr Beryle Beams.  Per his order we will give her half a liter of saline over an hour and reassess.  200 cc of blood was removed.

## 2015-05-20 NOTE — Progress Notes (Signed)
Pt's VS stable at time of D/C and pt states she feels better.  Pt appears to be back to baseline

## 2015-07-27 ENCOUNTER — Ambulatory Visit (HOSPITAL_COMMUNITY)
Admission: RE | Admit: 2015-07-27 | Discharge: 2015-07-27 | Disposition: A | Discharge status: Home / Self Care | Payer: Medicare Other | Source: Ambulatory Visit | Attending: Oncology | Admitting: Oncology

## 2015-07-27 ENCOUNTER — Other Ambulatory Visit (INDEPENDENT_AMBULATORY_CARE_PROVIDER_SITE_OTHER): Payer: Medicare Other

## 2015-07-27 DIAGNOSIS — R932 Abnormal findings on diagnostic imaging of liver and biliary tract: Secondary | ICD-10-CM | POA: Diagnosis not present

## 2015-07-27 DIAGNOSIS — R7989 Other specified abnormal findings of blood chemistry: Secondary | ICD-10-CM | POA: Insufficient documentation

## 2015-07-28 LAB — COMPREHENSIVE METABOLIC PANEL
A/G RATIO: 1.9 (ref 1.1–2.5)
ALT: 35 IU/L — AB (ref 0–32)
AST: 26 IU/L (ref 0–40)
Albumin: 4.3 g/dL (ref 3.5–4.8)
Alkaline Phosphatase: 43 IU/L (ref 39–117)
BUN/Creatinine Ratio: 22 (ref 11–26)
BUN: 15 mg/dL (ref 8–27)
Bilirubin Total: 0.4 mg/dL (ref 0.0–1.2)
CALCIUM: 9.3 mg/dL (ref 8.7–10.3)
CO2: 24 mmol/L (ref 18–29)
CREATININE: 0.69 mg/dL (ref 0.57–1.00)
Chloride: 103 mmol/L (ref 96–106)
GFR, EST AFRICAN AMERICAN: 101 mL/min/{1.73_m2} (ref >59)
GFR, EST NON AFRICAN AMERICAN: 88 mL/min/{1.73_m2} (ref >59)
GLUCOSE: 118 mg/dL — AB (ref 65–99)
Globulin, Total: 2.3 g/dL (ref 1.5–4.5)
Potassium: 4.4 mmol/L (ref 3.5–5.2)
Sodium: 143 mmol/L (ref 134–144)
TOTAL PROTEIN: 6.6 g/dL (ref 6.0–8.5)

## 2015-07-28 LAB — CBC WITH DIFFERENTIAL/PLATELET
Basophils Absolute: 0 10*3/uL (ref 0.0–0.2)
Basos: 0 %
EOS (ABSOLUTE): 0.2 10*3/uL (ref 0.0–0.4)
Eos: 4 %
HEMATOCRIT: 42.5 % (ref 34.0–46.6)
HEMOGLOBIN: 14 g/dL (ref 11.1–15.9)
Immature Grans (Abs): 0 10*3/uL (ref 0.0–0.1)
Immature Granulocytes: 0 %
LYMPHS: 32 %
Lymphocytes Absolute: 1.5 10*3/uL (ref 0.7–3.1)
MCH: 34.1 pg — AB (ref 26.6–33.0)
MCHC: 32.9 g/dL (ref 31.5–35.7)
MCV: 103 fL — ABNORMAL HIGH (ref 79–97)
Monocytes Absolute: 0.4 10*3/uL (ref 0.1–0.9)
Monocytes: 9 %
NEUTROS PCT: 55 %
Neutrophils Absolute: 2.6 10*3/uL (ref 1.4–7.0)
PLATELETS: 244 10*3/uL (ref 150–379)
RBC: 4.11 x10E6/uL (ref 3.77–5.28)
RDW: 12.3 % (ref 12.3–15.4)
WBC: 4.8 10*3/uL (ref 3.4–10.8)

## 2015-07-28 LAB — FERRITIN: Ferritin: 101 ng/mL (ref 15–150)

## 2015-07-29 ENCOUNTER — Telehealth: Payer: Self-pay | Admitting: *Deleted

## 2015-07-29 NOTE — Telephone Encounter (Signed)
-----   Message from Annia Belt, MD sent at 07/28/2015  2:18 PM EST ----- Call pt: ferritin 100 which is OK for now. 5 out of 6 liver tests normal, one borderline increased by 2 points but 11 points better than last time we checked in September. Non specific changes on ultrasound of the live. Tiny, single, benign cyst.

## 2015-07-29 NOTE — Telephone Encounter (Signed)
Pt called / informed "ferritin 100 which is OK for now. 5 out of 6 liver tests normal, one borderline increased by 2 points but 11 points better than last time we checked in September. Non specific changes on ultrasound of the live. Tiny, single, benign cyst." per Dr Beryle Beams.  Requested report - will mail to pt; also find on My Chart. Stated she will be out of town on 1/10 /17 and has to re-schedule her appt; I will inform Susette Racer.

## 2015-08-10 ENCOUNTER — Ambulatory Visit: Payer: Medicare Other | Admitting: Oncology

## 2015-09-13 ENCOUNTER — Ambulatory Visit (INDEPENDENT_AMBULATORY_CARE_PROVIDER_SITE_OTHER): Payer: Medicare Other | Admitting: Oncology

## 2015-09-13 ENCOUNTER — Encounter: Payer: Self-pay | Admitting: Oncology

## 2015-09-13 DIAGNOSIS — K900 Celiac disease: Secondary | ICD-10-CM

## 2015-09-13 DIAGNOSIS — K7581 Nonalcoholic steatohepatitis (NASH): Secondary | ICD-10-CM

## 2015-09-13 NOTE — Patient Instructions (Signed)
Phlebotomy every 4 months at [facility] short stay next 09/27/15 MD visit in 6 months lab day of visit

## 2015-09-13 NOTE — Progress Notes (Signed)
Patient ID: Jessica Conley, female   DOB: 08-26-1945, 70 y.o.   MRN: [id number] Hematology and Oncology Follow Up Visit  Jessica Conley [id number] 1945-11-10 70 y.o. 09/13/2015 4:42 PM   Principle Diagnosis: Encounter Diagnosis  Name Primary?  . Hemochromatosis, hereditary ([place]) Yes  Clinical Summary: Pleasant 70 year old woman who is a homozygote for the C282Y hemochromatosis gene. Peak ferritin never higher than 461 at time of diagnosis in March of 2012. She had a liver biopsy on 09/09/2010. 4+ iron. Mild periportal inflammation consistent with underlying steatosis. She was initially started on a phlebotomy program which she tolerated poorly but which was successful in rapidly reducing her ferritin into the normal range. Over time and without additional phlebotomy, her ferritin levels have continued to decrease but started to drift up again at time of her last visit with me in September 2016 to a value of 155. I put her back on a phlebotomy program. Once again, she had poor tolerance and had a brief syncopal episode at the conclusion of the procedure. She stabilized quickly with saline administration. Most recent ferritin done 07/27/2015 was 101.  She has mild, chronic, elevation of SGPT liver enzyme with normal SGOT and bilirubin and a low alkaline phosphatase level. Ultrasound of the abdomen done September 2009 showed probable mild diffuse fatty infiltration of the liver without any focal abnormalities.   Interim History:    Medications: reviewed  Allergies:  Allergies  Allergen Reactions  . Wheat     ciliac disease  . Penicillins Rash    Waterblisters     Review of Systems: See HPI  She continues on a gluten-free diet for adult celiac disease. She denies any paresthesias. Remaining ROS negative:   Physical Exam: Blood pressure 146/88, pulse 78, temperature 98 F (36.7 C), temperature source Oral, height 5' 8"  (1.727 m), weight 151 lb 9.6 oz (68.765 kg), SpO2 99  %. Wt Readings from Last 3 Encounters:  09/13/15 151 lb 9.6 oz (68.765 kg)  05/20/15 148 lb (67.132 kg)  04/27/15 151 lb (68.493 kg)     General appearance: Tall, thin, Caucasian woman HENNT: Pharynx no erythema, exudate, mass, or ulcer. No thyromegaly or thyroid nodules Lymph nodes: No cervical, supraclavicular, or axillary lymphadenopathy Breasts:  Lungs: Clear to auscultation, resonant to percussion throughout Heart: Regular rhythm, no murmur, no gallop, no rub, no click, no edema Abdomen: Soft, nontender, normal bowel sounds, no mass, no organomegaly Extremities: No edema, no calf tenderness Musculoskeletal: no joint deformities GU:  Vascular: Carotid pulses 2+, no bruits,  Neurologic: Alert, oriented, PERRLA,  cranial nerves grossly normal, motor strength 5 over 5, reflexes 1+ symmetric, upper body coordination normal, gait normal, Skin: No rash or ecchymosis  Lab Results: CBC W/Diff    Component Value Date/Time   WBC 4.8 07/27/2015 1005   WBC 5.2 10/12/2014 0946   WBC 6.8 09/24/2013 0903   RBC 4.11 07/27/2015 1005   RBC 4.19 10/12/2014 0946   RBC 4.28 09/24/2013 0903   HGB 14.5 10/12/2014 0946   HGB 14.7 09/24/2013 0903   HCT 42.5 07/27/2015 1005   HCT 42.3 10/12/2014 0946   HCT 42.7 09/24/2013 0903   PLT 244 07/27/2015 1005   PLT 252 10/12/2014 0946   PLT 246 09/24/2013 0903   MCV 103* 07/27/2015 1005   MCV 101.0* 10/12/2014 0946   MCV 99.7 09/24/2013 0903   MCH 34.1* 07/27/2015 1005   MCH 34.6* 10/12/2014 0946   MCH 34.3* 09/24/2013 0903   MCHC 32.9 07/27/2015 1005  MCHC 34.3 10/12/2014 0946   MCHC 34.4 09/24/2013 0903   RDW 12.3 07/27/2015 1005   RDW 13.0 10/12/2014 0946   RDW 12.7 09/24/2013 0903   LYMPHSABS 1.5 07/27/2015 1005   LYMPHSABS 1.9 10/12/2014 0946   LYMPHSABS 2.0 09/24/2013 0903   MONOABS 0.6 10/12/2014 0946   MONOABS 0.7 09/24/2013 0903   EOSABS 0.2 07/27/2015 1005   EOSABS 0.1 10/12/2014 0946   EOSABS 0.1 09/24/2013 0903   BASOSABS  0.0 07/27/2015 1005   BASOSABS 0.1 10/12/2014 0946   BASOSABS 0.0 09/24/2013 0903     Chemistry      Component Value Date/Time   NA 143 07/27/2015 1005   NA 139 10/12/2014 0946   NA 140 09/24/2013 0903   K 4.4 07/27/2015 1005   K 4.7 09/24/2013 0903   CL 103 07/27/2015 1005   CO2 24 07/27/2015 1005   CO2 30* 09/24/2013 0903   BUN 15 07/27/2015 1005   BUN 15 10/12/2014 0946   BUN 17.0 09/24/2013 0903   CREATININE 0.69 07/27/2015 1005   CREATININE 0.71 10/12/2014 0946   CREATININE 0.9 09/24/2013 0903      Component Value Date/Time   CALCIUM 9.3 07/27/2015 1005   CALCIUM 9.9 09/24/2013 0903   ALKPHOS 43 07/27/2015 1005   ALKPHOS 46 09/24/2013 0903   AST 26 07/27/2015 1005   AST 35* 09/24/2013 0903   ALT 35* 07/27/2015 1005   ALT 58* 09/24/2013 0903   BILITOT 0.4 07/27/2015 1005   BILITOT 0.6 10/12/2014 0946   BILITOT 0.54 09/24/2013 7841       Radiological Studies: No results found.  Impression:  #1. Homozygote status for hemachromatosis gene. I will limit phlebotomy to 250-300 ml and give an equivalent amount of saline with the procedure. Decrease frequency to every 4 months. We may be able to just do a phlebotomy once or twice a year. I would be satisfied with a ferritin of 150 or less. Liver functions remained stable with a minimal elevation of SGPT otherwise normal. New orders placed for ongoing phlebotomy.  #2. Adult celiac disease Not active on gluten-free diet.  #3. NASH - mild  CC: Patient Care Team: Jani Gravel, MD as PCP - General (Internal Medicine)   Annia Belt, MD 2/13/20174:42 PM

## 2015-09-27 ENCOUNTER — Encounter (HOSPITAL_COMMUNITY)
Admission: RE | Admit: 2015-09-27 | Discharge: 2015-09-27 | Disposition: A | Discharge status: Home / Self Care | Payer: Medicare Other | Source: Ambulatory Visit | Attending: Oncology | Admitting: Oncology

## 2015-09-27 LAB — FERRITIN: Ferritin: 70 ng/mL (ref 11–307)

## 2015-09-27 MED ORDER — SODIUM CHLORIDE 0.9 % IV SOLN
Freq: Once | INTRAVENOUS | Status: AC
  Administered 2015-09-27: 250 mL via INTRAVENOUS

## 2015-09-27 NOTE — Progress Notes (Signed)
Administered 250cc of NS and phlebotomized 300cc of blood and drew a ferritin level after phlebotomy done.  Pt tolerated procedure well.

## 2015-10-04 DIAGNOSIS — Z1231 Encounter for screening mammogram for malignant neoplasm of breast: Secondary | ICD-10-CM | POA: Diagnosis not present

## 2015-10-04 DIAGNOSIS — Z803 Family history of malignant neoplasm of breast: Secondary | ICD-10-CM | POA: Diagnosis not present

## 2015-10-06 DIAGNOSIS — Z1231 Encounter for screening mammogram for malignant neoplasm of breast: Secondary | ICD-10-CM | POA: Diagnosis not present

## 2015-10-06 DIAGNOSIS — Z803 Family history of malignant neoplasm of breast: Secondary | ICD-10-CM | POA: Diagnosis not present

## 2015-10-06 DIAGNOSIS — N83202 Unspecified ovarian cyst, left side: Secondary | ICD-10-CM | POA: Diagnosis not present

## 2015-10-20 DIAGNOSIS — Z961 Presence of intraocular lens: Secondary | ICD-10-CM | POA: Diagnosis not present

## 2015-10-20 DIAGNOSIS — H40013 Open angle with borderline findings, low risk, bilateral: Secondary | ICD-10-CM | POA: Diagnosis not present

## 2015-10-20 DIAGNOSIS — H43393 Other vitreous opacities, bilateral: Secondary | ICD-10-CM | POA: Diagnosis not present

## 2015-10-20 DIAGNOSIS — H35373 Puckering of macula, bilateral: Secondary | ICD-10-CM | POA: Diagnosis not present

## 2015-10-27 DIAGNOSIS — I10 Essential (primary) hypertension: Secondary | ICD-10-CM | POA: Diagnosis not present

## 2015-11-04 DIAGNOSIS — R739 Hyperglycemia, unspecified: Secondary | ICD-10-CM | POA: Diagnosis not present

## 2015-11-04 DIAGNOSIS — I10 Essential (primary) hypertension: Secondary | ICD-10-CM | POA: Diagnosis not present

## 2015-12-28 ENCOUNTER — Telehealth: Payer: Self-pay | Admitting: *Deleted

## 2015-12-28 NOTE — Telephone Encounter (Signed)
Message left per pt - stated last phlebotomy was 2/27; next one should be end of June. Wants to know if this is correct; needs order and an appt. Thanks

## 2015-12-29 ENCOUNTER — Other Ambulatory Visit: Payer: Self-pay | Admitting: Oncology

## 2015-12-30 NOTE — Telephone Encounter (Signed)
Pt called - no answer; left message - phlebotomy appt June 29 @ 1000 AM here at [place].

## 2016-01-26 ENCOUNTER — Other Ambulatory Visit (HOSPITAL_COMMUNITY): Payer: Self-pay | Admitting: *Deleted

## 2016-01-27 ENCOUNTER — Ambulatory Visit (HOSPITAL_COMMUNITY)
Admission: RE | Admit: 2016-01-27 | Discharge: 2016-01-27 | Disposition: A | Discharge status: Home / Self Care | Payer: Medicare Other | Source: Ambulatory Visit | Attending: Oncology | Admitting: Oncology

## 2016-01-27 LAB — POCT HEMOGLOBIN-HEMACUE: Hemoglobin: 14.1 g/dL (ref 12.0–15.0)

## 2016-01-27 MED ORDER — SODIUM CHLORIDE 0.9 % IV SOLN
Freq: Once | INTRAVENOUS | Status: AC
  Administered 2016-01-27: 250 mL via INTRAVENOUS

## 2016-03-07 ENCOUNTER — Ambulatory Visit (INDEPENDENT_AMBULATORY_CARE_PROVIDER_SITE_OTHER): Payer: Medicare Other | Admitting: Oncology

## 2016-03-07 ENCOUNTER — Encounter: Payer: Self-pay | Admitting: Oncology

## 2016-03-07 DIAGNOSIS — K7581 Nonalcoholic steatohepatitis (NASH): Secondary | ICD-10-CM | POA: Diagnosis not present

## 2016-03-07 DIAGNOSIS — K900 Celiac disease: Secondary | ICD-10-CM | POA: Diagnosis not present

## 2016-03-07 NOTE — Patient Instructions (Addendum)
To lab today Next phlebotomy on Monday October 30 MD visit 6 months Lab 1 week before visit

## 2016-03-07 NOTE — Progress Notes (Signed)
Hematology and Oncology Follow Up Visit  Jessica Conley [id number] July 26, 1946 70 y.o. 03/07/2016 2:04 PM   Principle Diagnosis: Encounter Diagnosis  Name Primary?  . Hemochromatosis, hereditary ([place]) Yes  Clinical summary: Pleasant 71 year old woman who is a homozygote for the C282Y hemochromatosis gene. Peak ferritin never higher than 461 at time of diagnosis in March of 2012. She had a liver biopsy on 09/09/2010. 4+ iron. Mild periportal inflammation consistent with underlying steatosis. She was initially started on a phlebotomy program which she tolerated poorly but which was successful in rapidly reducing her ferritin into the normal range. Over time and without additional phlebotomy, her ferritin levels have continued to decrease but started to drift up again at time of her last visit with me in September 2016 to a value of 155. I put her back on a phlebotomy program. Once again, she had poor tolerance and had a brief syncopal episode at the conclusion of the procedure. She stabilized quickly with saline administration. Most recent ferritin done 07/27/2015 was 101.  She has mild, chronic, elevation of SGPT liver enzyme with normal SGOT and bilirubin and a low alkaline phosphatase level. Ultrasound of the abdomen done September 2009 showed probable mild diffuse fatty infiltration of the liver without any focal abnormalities.  Interim History:  She is doing well. No interim medical problems. She is tolerating a partial phlebotomy with concomitant saline infusion. Most recent ferritin recorded on February 27 now well within target range at 70. Liver functions as of December 2016 have normalized. She does have some fluctuating low-grade transaminase elevations likely due to fatty liver. 2 of her grandchildren will be arriving today to spend some time here for the summer. Medications: reviewed  Allergies:  Allergies  Allergen Reactions  . Wheat     ciliac disease  . Penicillins Rash     Waterblisters     Review of Systems: See interim history Remaining ROS negative:   Physical Exam: Blood pressure (!) 141/86, pulse 70, temperature 98.3 F (36.8 C), temperature source Oral, height 5' 8"  (1.727 m), weight 151 lb 12.8 oz (68.9 kg), SpO2 97 %. Wt Readings from Last 3 Encounters:  03/07/16 151 lb 12.8 oz (68.9 kg)  01/27/16 148 lb (67.1 kg)  09/13/15 151 lb 9.6 oz (68.8 kg)     General appearance: Thin, Well-nourished Caucasian woman HENNT: Pharynx no erythema, exudate, mass, or ulcer. No thyromegaly or thyroid nodules Lymph nodes: No cervical, supraclavicular, or axillary lymphadenopathy Breasts:  Lungs: Clear to auscultation, resonant to percussion throughout Heart: Regular rhythm, no murmur, no gallop, no rub, no click, no edema Abdomen: Soft, nontender, normal bowel sounds, no mass, no organomegaly Extremities: No edema, no calf tenderness Musculoskeletal: no joint deformities GU:  Vascular: Carotid pulses 2+, no bruits, Neurologic: Alert, oriented, PERRLA,  cranial nerves grossly normal, motor strength 5 over 5, reflexes 1+ symmetric, upper body coordination normal, gait normal, Skin: No rash or ecchymosis  Lab Results: CBC W/Diff    Component Value Date/Time   WBC 4.8 07/27/2015 1005   WBC 5.2 10/12/2014 0946   RBC 4.11 07/27/2015 1005   RBC 4.19 10/12/2014 0946   HGB 14.1 01/27/2016 1025   HGB 14.7 09/24/2013 0903   HCT 42.5 07/27/2015 1005   HCT 42.7 09/24/2013 0903   PLT 244 07/27/2015 1005   MCV 103 (H) 07/27/2015 1005   MCV 99.7 09/24/2013 0903   MCH 34.1 (H) 07/27/2015 1005   MCH 34.6 (H) 10/12/2014 0946   MCHC 32.9 07/27/2015 1005  MCHC 34.3 10/12/2014 0946   RDW 12.3 07/27/2015 1005   RDW 12.7 09/24/2013 0903   LYMPHSABS 1.5 07/27/2015 1005   LYMPHSABS 2.0 09/24/2013 0903   MONOABS 0.6 10/12/2014 0946   MONOABS 0.7 09/24/2013 0903   EOSABS 0.2 07/27/2015 1005   BASOSABS 0.0 07/27/2015 1005   BASOSABS 0.0 09/24/2013 0903      Chemistry      Component Value Date/Time   NA 143 07/27/2015 1005   NA 140 09/24/2013 0903   K 4.4 07/27/2015 1005   K 4.7 09/24/2013 0903   CL 103 07/27/2015 1005   CO2 24 07/27/2015 1005   CO2 30 (H) 09/24/2013 0903   BUN 15 07/27/2015 1005   BUN 17.0 09/24/2013 0903   CREATININE 0.69 07/27/2015 1005   CREATININE 0.71 10/12/2014 0946   CREATININE 0.9 09/24/2013 0903      Component Value Date/Time   CALCIUM 9.3 07/27/2015 1005   CALCIUM 9.9 09/24/2013 0903   ALKPHOS 43 07/27/2015 1005   ALKPHOS 46 09/24/2013 0903   AST 26 07/27/2015 1005   AST 35 (H) 09/24/2013 0903   ALT 35 (H) 07/27/2015 1005   ALT 58 (H) 09/24/2013 0903   BILITOT 0.4 07/27/2015 1005   BILITOT 0.54 09/24/2013 4259       Radiological Studies: No results found.  Impression:  #1. Homozygote status for hemachromatosis gene. She remained stable on every 4 month phlebotomy at 8300 mL phlebotomy volume. New orders placed for ongoing phlebotomy. Next due at the end of October. Ferritin today pending.  #2. Adult celiac disease Not active on gluten-free diet.  #3. NASH - mild Repeat liver functions today pending.  CC: Patient Care Team: Jani Gravel, MD as PCP - General (Internal Medicine)   Annia Belt, MD 8/8/20172:04 PM

## 2016-03-08 LAB — COMPREHENSIVE METABOLIC PANEL
ALT: 66 IU/L — ABNORMAL HIGH (ref 0–32)
AST: 40 IU/L (ref 0–40)
Albumin/Globulin Ratio: 1.7 (ref 1.2–2.2)
Albumin: 4.5 g/dL (ref 3.5–4.8)
Alkaline Phosphatase: 40 IU/L (ref 39–117)
BUN/Creatinine Ratio: 18 (ref 12–28)
BUN: 13 mg/dL (ref 8–27)
Bilirubin Total: 0.6 mg/dL (ref 0.0–1.2)
CO2: 23 mmol/L (ref 18–29)
CREATININE: 0.71 mg/dL (ref 0.57–1.00)
Calcium: 9.6 mg/dL (ref 8.7–10.3)
Chloride: 98 mmol/L (ref 96–106)
GFR calc Af Amer: 98 mL/min/{1.73_m2} (ref >59)
GFR, EST NON AFRICAN AMERICAN: 85 mL/min/{1.73_m2} (ref >59)
GLOBULIN, TOTAL: 2.7 g/dL (ref 1.5–4.5)
GLUCOSE: 91 mg/dL (ref 65–99)
Potassium: 4.2 mmol/L (ref 3.5–5.2)
SODIUM: 140 mmol/L (ref 134–144)
Total Protein: 7.2 g/dL (ref 6.0–8.5)

## 2016-03-08 LAB — CBC WITH DIFFERENTIAL/PLATELET
BASOS: 1 %
Basophils Absolute: 0 10*3/uL (ref 0.0–0.2)
EOS (ABSOLUTE): 0.1 10*3/uL (ref 0.0–0.4)
EOS: 1 %
HEMATOCRIT: 40.8 % (ref 34.0–46.6)
HEMOGLOBIN: 13.9 g/dL (ref 11.1–15.9)
IMMATURE GRANS (ABS): 0 10*3/uL (ref 0.0–0.1)
IMMATURE GRANULOCYTES: 0 %
LYMPHS: 39 %
Lymphocytes Absolute: 1.9 10*3/uL (ref 0.7–3.1)
MCH: 33.9 pg — ABNORMAL HIGH (ref 26.6–33.0)
MCHC: 34.1 g/dL (ref 31.5–35.7)
MCV: 100 fL — AB (ref 79–97)
MONOCYTES: 14 %
MONOS ABS: 0.7 10*3/uL (ref 0.1–0.9)
NEUTROS PCT: 45 %
Neutrophils Absolute: 2.2 10*3/uL (ref 1.4–7.0)
Platelets: 271 10*3/uL (ref 150–379)
RBC: 4.1 x10E6/uL (ref 3.77–5.28)
RDW: 13.1 % (ref 12.3–15.4)
WBC: 4.8 10*3/uL (ref 3.4–10.8)

## 2016-03-08 LAB — FERRITIN: Ferritin: 164 ng/mL — ABNORMAL HIGH (ref 15–150)

## 2016-03-09 ENCOUNTER — Telehealth: Payer: Self-pay | Admitting: *Deleted

## 2016-03-09 NOTE — Telephone Encounter (Signed)
Pt called - no answer; left message ferritin level up to 164 and Dr Beryle Beams would like for her to have phlebotomy this month after her grandchildren leave. Left [name] # at Short Stay if she wants to sched appt herself or to call me back and I will sched the app.

## 2016-03-09 NOTE — Telephone Encounter (Signed)
-----   Message from Annia Belt, MD sent at 03/08/2016 10:12 AM EDT ----- Call pt: ferritin has crept up a little to 164. I would like her to have next phlebotomy this month. Find out when would be convenient after her grandkids leave.

## 2016-03-14 ENCOUNTER — Other Ambulatory Visit (HOSPITAL_COMMUNITY): Payer: Self-pay | Admitting: *Deleted

## 2016-03-15 ENCOUNTER — Ambulatory Visit (HOSPITAL_COMMUNITY)
Admission: RE | Admit: 2016-03-15 | Discharge: 2016-03-15 | Disposition: A | Discharge status: Home / Self Care | Payer: Medicare Other | Source: Ambulatory Visit | Attending: Oncology | Admitting: Oncology

## 2016-03-15 MED ORDER — SODIUM CHLORIDE 0.9 % IV SOLN
INTRAVENOUS | Status: DC
  Administered 2016-03-15: 11:00:00 via INTRAVENOUS

## 2016-03-15 MED ORDER — SODIUM CHLORIDE 0.9 % IV BOLUS (SEPSIS)
300.0000 mL | Freq: Once | INTRAVENOUS | Status: DC
  Administered 2016-03-15: 300 mL via INTRAVENOUS

## 2016-03-15 NOTE — Progress Notes (Signed)
Therapeutic phlebotomy performed without difficulty while infusing 300cc NS simultaneously.  300cc blood removed.  Pt tolerated procedure with no complaints.  Will monitor until discharge.

## 2016-03-22 NOTE — Telephone Encounter (Signed)
Phlebotomy done 03/15/16.

## 2016-05-15 DIAGNOSIS — I10 Essential (primary) hypertension: Secondary | ICD-10-CM | POA: Diagnosis not present

## 2016-05-15 DIAGNOSIS — R739 Hyperglycemia, unspecified: Secondary | ICD-10-CM | POA: Diagnosis not present

## 2016-05-22 DIAGNOSIS — E7800 Pure hypercholesterolemia, unspecified: Secondary | ICD-10-CM | POA: Diagnosis not present

## 2016-05-22 DIAGNOSIS — Z23 Encounter for immunization: Secondary | ICD-10-CM | POA: Diagnosis not present

## 2016-05-22 DIAGNOSIS — Z0000 Encounter for general adult medical examination without abnormal findings: Secondary | ICD-10-CM | POA: Diagnosis not present

## 2016-05-22 DIAGNOSIS — I10 Essential (primary) hypertension: Secondary | ICD-10-CM | POA: Diagnosis not present

## 2016-06-01 DIAGNOSIS — H04123 Dry eye syndrome of bilateral lacrimal glands: Secondary | ICD-10-CM | POA: Diagnosis not present

## 2016-06-01 DIAGNOSIS — H01003 Unspecified blepharitis right eye, unspecified eyelid: Secondary | ICD-10-CM | POA: Diagnosis not present

## 2016-06-01 DIAGNOSIS — H40013 Open angle with borderline findings, low risk, bilateral: Secondary | ICD-10-CM | POA: Diagnosis not present

## 2016-06-28 ENCOUNTER — Telehealth: Payer: Self-pay | Admitting: *Deleted

## 2016-06-28 NOTE — Telephone Encounter (Signed)
Message left per pt - wanting to know if next phlebotomy this month? I will send message to Dr Darnell Level to see if she's on a every 2 or 4 month schedule. Last one was in August.

## 2016-07-03 ENCOUNTER — Other Ambulatory Visit: Payer: Self-pay | Admitting: Oncology

## 2016-07-03 NOTE — Telephone Encounter (Signed)
Phlebotomy appt schedule next Friday 12/15 @ 1000 AM at [place]. Pt called / informed.

## 2016-07-13 ENCOUNTER — Other Ambulatory Visit (HOSPITAL_COMMUNITY): Payer: Self-pay | Admitting: *Deleted

## 2016-07-14 ENCOUNTER — Ambulatory Visit (HOSPITAL_COMMUNITY)
Admission: RE | Admit: 2016-07-14 | Discharge: 2016-07-14 | Disposition: A | Discharge status: Home / Self Care | Payer: Medicare Other | Source: Ambulatory Visit | Attending: Oncology | Admitting: Oncology

## 2016-07-14 ENCOUNTER — Telehealth: Payer: Self-pay | Admitting: *Deleted

## 2016-07-14 LAB — COMPREHENSIVE METABOLIC PANEL
ALT: 48 U/L (ref 14–54)
AST: 39 U/L (ref 15–41)
Albumin: 4.1 g/dL (ref 3.5–5.0)
Alkaline Phosphatase: 36 U/L — ABNORMAL LOW (ref 38–126)
Anion gap: 10 (ref 5–15)
BUN: 11 mg/dL (ref 6–20)
CHLORIDE: 103 mmol/L (ref 101–111)
CO2: 24 mmol/L (ref 22–32)
Calcium: 9.3 mg/dL (ref 8.9–10.3)
Creatinine, Ser: 0.82 mg/dL (ref 0.44–1.00)
Glucose, Bld: 114 mg/dL — ABNORMAL HIGH (ref 65–99)
POTASSIUM: 3.5 mmol/L (ref 3.5–5.1)
SODIUM: 137 mmol/L (ref 135–145)
Total Bilirubin: 0.9 mg/dL (ref 0.3–1.2)
Total Protein: 7.1 g/dL (ref 6.5–8.1)

## 2016-07-14 LAB — POCT HEMOGLOBIN-HEMACUE: HEMOGLOBIN: 14.7 g/dL (ref 12.0–15.0)

## 2016-07-14 LAB — FERRITIN: FERRITIN: 69 ng/mL (ref 11–307)

## 2016-07-14 MED ORDER — SODIUM CHLORIDE 0.9 % IV SOLN
Freq: Once | INTRAVENOUS | Status: AC
Start: 2016-07-14 — End: 2016-07-14
  Administered 2016-07-14: 10:00:00 via INTRAVENOUS

## 2016-07-14 NOTE — Telephone Encounter (Signed)
Pt called - no answer; left message "ferritin back in good range at 69" per Dr Beryle Beams. And call for any questions.

## 2016-07-14 NOTE — Progress Notes (Signed)
Pt received 250 cc fluid bolus during phlebotomy. 500gm removed without difficulty from right AC. Tolerated well. VSS  Pt states she did not take her blood pressure medicine today as she did not want it to drop too low. Denies dizziness or light headedness.  Discharged ambulatory.

## 2016-07-14 NOTE — Telephone Encounter (Signed)
-----   Message from Jessica Belt, MD sent at 07/14/2016  1:53 PM EST ----- Call pt: ferritin back in good range at 69

## 2016-10-04 ENCOUNTER — Other Ambulatory Visit: Payer: Self-pay | Admitting: Internal Medicine

## 2016-10-13 ENCOUNTER — Telehealth: Payer: Self-pay | Admitting: *Deleted

## 2016-10-13 NOTE — Telephone Encounter (Signed)
Returned pt's call - Stated Dr Maudie Mercury has ordered another U/S; last one was done 2016.  Wanted to know if she needs another one?   Asked her if Dr Maudie Mercury told her why he ordered another one and is he aware of the one done in 2016. Stated she will call his office to find out. And call us back if needed.

## 2016-10-13 NOTE — Telephone Encounter (Signed)
If she does get one, tell her it needs to be done with the special technique to look for any scarring in the liver called ultrasound fibroelastography

## 2016-10-16 NOTE — Telephone Encounter (Signed)
Called pt - no answer; left message if decide to have this done, "needs to be done with the special technique to look for any scarring in the liver called ultrasound fibroelastography" per Dr Beryle Beams. And to call for any questions.

## 2016-11-06 ENCOUNTER — Ambulatory Visit
Admission: RE | Admit: 2016-11-06 | Discharge: 2016-11-06 | Disposition: A | Discharge status: Home / Self Care | Payer: Medicare Other | Source: Ambulatory Visit | Attending: Internal Medicine | Admitting: Internal Medicine

## 2016-11-06 DIAGNOSIS — K760 Fatty (change of) liver, not elsewhere classified: Secondary | ICD-10-CM | POA: Diagnosis not present

## 2016-11-14 DIAGNOSIS — I10 Essential (primary) hypertension: Secondary | ICD-10-CM | POA: Diagnosis not present

## 2016-11-14 DIAGNOSIS — R739 Hyperglycemia, unspecified: Secondary | ICD-10-CM | POA: Diagnosis not present

## 2016-11-30 DIAGNOSIS — R739 Hyperglycemia, unspecified: Secondary | ICD-10-CM | POA: Diagnosis not present

## 2016-11-30 DIAGNOSIS — Z0000 Encounter for general adult medical examination without abnormal findings: Secondary | ICD-10-CM | POA: Diagnosis not present

## 2016-11-30 DIAGNOSIS — Z780 Asymptomatic menopausal state: Secondary | ICD-10-CM | POA: Diagnosis not present

## 2016-11-30 DIAGNOSIS — I10 Essential (primary) hypertension: Secondary | ICD-10-CM | POA: Diagnosis not present

## 2016-12-01 DIAGNOSIS — Z1231 Encounter for screening mammogram for malignant neoplasm of breast: Secondary | ICD-10-CM | POA: Diagnosis not present

## 2016-12-01 DIAGNOSIS — Z803 Family history of malignant neoplasm of breast: Secondary | ICD-10-CM | POA: Diagnosis not present

## 2017-01-12 DIAGNOSIS — H04123 Dry eye syndrome of bilateral lacrimal glands: Secondary | ICD-10-CM | POA: Diagnosis not present

## 2017-01-12 DIAGNOSIS — H40013 Open angle with borderline findings, low risk, bilateral: Secondary | ICD-10-CM | POA: Diagnosis not present

## 2017-01-12 DIAGNOSIS — Z961 Presence of intraocular lens: Secondary | ICD-10-CM | POA: Diagnosis not present

## 2017-01-12 DIAGNOSIS — H35373 Puckering of macula, bilateral: Secondary | ICD-10-CM | POA: Diagnosis not present

## 2017-01-24 ENCOUNTER — Telehealth: Payer: Self-pay | Admitting: *Deleted

## 2017-01-24 ENCOUNTER — Other Ambulatory Visit: Payer: Self-pay | Admitting: Oncology

## 2017-01-24 NOTE — Telephone Encounter (Signed)
[facility] Short Stay has available appt for phlebotomy on July 5th @ 0800 AM. Jessica Conley / informed pt of appt ; aware to go to Admissions first to register.

## 2017-01-24 NOTE — Telephone Encounter (Signed)
Thanks I put in orders

## 2017-02-01 ENCOUNTER — Ambulatory Visit (HOSPITAL_COMMUNITY)
Admission: RE | Admit: 2017-02-01 | Discharge: 2017-02-01 | Disposition: A | Discharge status: Home / Self Care | Payer: Medicare Other | Source: Ambulatory Visit | Attending: Oncology | Admitting: Oncology

## 2017-02-01 LAB — CBC WITH DIFFERENTIAL/PLATELET
BASOS ABS: 0 10*3/uL (ref 0.0–0.1)
Basophils Relative: 0 %
Eosinophils Absolute: 0.1 10*3/uL (ref 0.0–0.7)
Eosinophils Relative: 2 %
HEMATOCRIT: 40.8 % (ref 36.0–46.0)
Hemoglobin: 13.9 g/dL (ref 12.0–15.0)
LYMPHS ABS: 1.9 10*3/uL (ref 0.7–4.0)
LYMPHS PCT: 39 %
MCH: 34.2 pg — ABNORMAL HIGH (ref 26.0–34.0)
MCHC: 34.1 g/dL (ref 30.0–36.0)
MCV: 100.2 fL — AB (ref 78.0–100.0)
MONO ABS: 0.5 10*3/uL (ref 0.1–1.0)
MONOS PCT: 9 %
NEUTROS ABS: 2.4 10*3/uL (ref 1.7–7.7)
Neutrophils Relative %: 50 %
Platelets: 228 10*3/uL (ref 150–400)
RBC: 4.07 MIL/uL (ref 3.87–5.11)
RDW: 12.5 % (ref 11.5–15.5)
WBC: 4.9 10*3/uL (ref 4.0–10.5)

## 2017-02-01 LAB — COMPREHENSIVE METABOLIC PANEL
ALT: 76 U/L — ABNORMAL HIGH (ref 14–54)
AST: 52 U/L — AB (ref 15–41)
Albumin: 4.2 g/dL (ref 3.5–5.0)
Alkaline Phosphatase: 36 U/L — ABNORMAL LOW (ref 38–126)
Anion gap: 10 (ref 5–15)
BILIRUBIN TOTAL: 0.9 mg/dL (ref 0.3–1.2)
BUN: 12 mg/dL (ref 6–20)
CO2: 25 mmol/L (ref 22–32)
CREATININE: 0.76 mg/dL (ref 0.44–1.00)
Calcium: 9 mg/dL (ref 8.9–10.3)
Chloride: 101 mmol/L (ref 101–111)
GFR calc Af Amer: >60 mL/min (ref >60)
Glucose, Bld: 152 mg/dL — ABNORMAL HIGH (ref 65–99)
POTASSIUM: 3.4 mmol/L — AB (ref 3.5–5.1)
Sodium: 136 mmol/L (ref 135–145)
TOTAL PROTEIN: 6.8 g/dL (ref 6.5–8.1)

## 2017-02-01 LAB — FERRITIN: FERRITIN: 75 ng/mL (ref 11–307)

## 2017-02-01 MED ORDER — SODIUM CHLORIDE 0.9 % IV BOLUS (SEPSIS)
300.0000 mL | Freq: Once | INTRAVENOUS | Status: AC
  Administered 2017-02-01: 300 mL via INTRAVENOUS

## 2017-02-01 NOTE — Progress Notes (Signed)
PT came in today for scheduled therapeutic phlebotomy.  CBC was done prior to procedure.  HBG was 13.9  300 cc of fluid was run in through left ac per MD order.  300 cc of blood was removed per MD order.  Right AC was used.  Pt tolerated procedure well.  Will monitor for 20 more minutes

## 2017-02-07 ENCOUNTER — Other Ambulatory Visit: Payer: Self-pay | Admitting: Oncology

## 2017-02-08 ENCOUNTER — Telehealth: Payer: Self-pay | Admitting: *Deleted

## 2017-02-08 NOTE — Telephone Encounter (Signed)
Pt informed "Ferritin storage iron level remains in good range at 75 on phlebotomy program. Blood sugar 152. Chronic mild inflammation of the liver. We will forward results to her primary care MD as well." per Dr Beryle Beams.  I will send a message to Jessica Conley to schedule appt in November - pt awared.

## 2017-02-08 NOTE — Telephone Encounter (Signed)
-----   Message from Annia Belt, MD sent at 02/07/2017  9:05 AM EDT ----- I am not sure patient was called with these results. Ferritin storage iron level remains in good range at 75 on phlebotomy program.  Blood sugar 152. Chronic mild inflammation of the liver. We will forward results to her primary care MD as well. I will schedule lab again for November & visit with me.

## 2017-02-08 NOTE — Telephone Encounter (Signed)
Called pt - no answer; left message to call me back.

## 2017-02-20 NOTE — Telephone Encounter (Signed)
Appt w/Dr G has been scheduled in November.

## 2017-02-22 ENCOUNTER — Other Ambulatory Visit: Payer: Self-pay | Admitting: Nurse Practitioner

## 2017-03-06 DIAGNOSIS — K5904 Chronic idiopathic constipation: Secondary | ICD-10-CM | POA: Diagnosis not present

## 2017-03-06 DIAGNOSIS — Z1211 Encounter for screening for malignant neoplasm of colon: Secondary | ICD-10-CM | POA: Diagnosis not present

## 2017-03-06 DIAGNOSIS — Z86010 Personal history of colonic polyps: Secondary | ICD-10-CM | POA: Diagnosis not present

## 2017-03-06 DIAGNOSIS — K219 Gastro-esophageal reflux disease without esophagitis: Secondary | ICD-10-CM | POA: Diagnosis not present

## 2017-04-11 DIAGNOSIS — K635 Polyp of colon: Secondary | ICD-10-CM | POA: Diagnosis not present

## 2017-04-11 DIAGNOSIS — D125 Benign neoplasm of sigmoid colon: Secondary | ICD-10-CM | POA: Diagnosis not present

## 2017-04-11 DIAGNOSIS — Z1211 Encounter for screening for malignant neoplasm of colon: Secondary | ICD-10-CM | POA: Diagnosis not present

## 2017-04-11 DIAGNOSIS — Z86010 Personal history of colonic polyps: Secondary | ICD-10-CM | POA: Diagnosis not present

## 2017-04-17 DIAGNOSIS — Z23 Encounter for immunization: Secondary | ICD-10-CM | POA: Diagnosis not present

## 2017-05-04 ENCOUNTER — Ambulatory Visit (HOSPITAL_COMMUNITY)
Admission: EM | Admit: 2017-05-04 | Discharge: 2017-05-04 | Disposition: A | Discharge status: Home / Self Care | Payer: Medicare Other | Attending: Physician Assistant | Admitting: Physician Assistant

## 2017-05-04 ENCOUNTER — Encounter (HOSPITAL_COMMUNITY): Payer: Self-pay | Admitting: Family Medicine

## 2017-05-04 DIAGNOSIS — R2232 Localized swelling, mass and lump, left upper limb: Secondary | ICD-10-CM

## 2017-05-04 DIAGNOSIS — M6740 Ganglion, unspecified site: Secondary | ICD-10-CM

## 2017-05-04 DIAGNOSIS — M71342 Other bursal cyst, left hand: Secondary | ICD-10-CM | POA: Diagnosis not present

## 2017-05-04 NOTE — ED Triage Notes (Signed)
Pt here for bump to left middle finger x 2 months. Reports that she has expressed it with no drainage. Mild pain.

## 2017-05-04 NOTE — ED Provider Notes (Signed)
[place]    CSN: [id number] Arrival date & time: 05/04/17  1339     History   Chief Complaint Chief Complaint  Patient presents with  . Hand Pain    HPI Jessica Conley is a 71 y.o. female.   Patient presents with a mildly painful "bump" to left middle finger x 2 months. She could not get into her Dermatologist so presented here. No injury. No redness or warmth is noted.       Past Medical History:  Diagnosis Date  . Celiac sprue   . Hemochromatosis, hereditary ([place]) 05/18/2010  . Hypertension     Patient Active Problem List   Diagnosis Date Noted  . Hemochromatosis, hereditary ([place]) 05/18/2010    Past Surgical History:  Procedure Laterality Date  . EYE SURGERY    . PHLEBOTOMY THERAPEUTIC  08/21/2011      . VAGINAL HYSTERECTOMY  early age 55s   prolapse    OB History    Gravida Para Term Preterm AB Living   3 3       3    SAB TAB Ectopic Multiple Live Births                   Home Medications    Prior to Admission medications   Medication Sig Start Date End Date Taking? Authorizing Provider  conjugated estrogens (PREMARIN) vaginal cream Place 1 Applicatorful vaginally 2 (two) times a week. 10/07/13   Fontaine, Belinda Block, MD  Cyanocobalamin (VITAMIN B-12 SL) Place 1 tablet under the tongue daily. 2517mg    [provider]  FOLIC ACID PO Take 1 tablet by mouth daily.    [provider]  losartan-hydrochlorothiazide (HYZAAR) 100-25 MG per tablet Take 1 tablet by mouth daily.    KJani Gravel MD  metFORMIN (GLUCOPHAGE) 500 MG tablet Take 500 mg by mouth daily.    [provider]  Multiple Vitamin (MULITIVITAMIN WITH MINERALS) TABS Take 1 tablet by mouth daily.    [provider]  pravastatin (PRAVACHOL) 20 MG tablet Take 40 mg by mouth daily.     [provider]    Family History Family History  Problem Relation Age of Onset  . Heart attack Mother   . Melanoma Father   . Breast cancer  Daughter 370   Social History Social History  Substance Use Topics  . Smoking status: Never Smoker  . Smokeless tobacco: Never Used  . Alcohol use 2.4 oz/week    4 Glasses of wine per week     Comment: Wine     Allergies   Wheat and Penicillins   Review of Systems Review of Systems  All other systems reviewed and are negative.    Physical Exam Triage Vital Signs ED Triage Vitals [05/04/17 1408]  Enc Vitals Group     BP (!) 154/90     Pulse Rate 81     Resp 18     Temp 98.4 F (36.9 C)     Temp src      SpO2 99 %     Weight      Height      Head Circumference      Peak Flow      Pain Score      Pain Loc      Pain Edu?      Excl. in [place]    No data found.   Updated Vital Signs BP (!) 154/90   Pulse 81  Temp 98.4 F (36.9 C)   Resp 18   SpO2 99%   Visual Acuity Right Eye Distance:   Left Eye Distance:   Bilateral Distance:    Right Eye Near:   Left Eye Near:    Bilateral Near:     Physical Exam  Constitutional: She appears well-developed and well-nourished.  Musculoskeletal:  Left cystic area to left finger tip just superior to the DIP, OA changes are noted in the DIP, no erythema or warmth  Skin: Skin is warm and dry.  Psychiatric: Her behavior is normal.  Nursing note and vitals reviewed.    UC Treatments / Results  Labs (all labs ordered are listed, but only abnormal results are displayed) Labs Reviewed - No data to display  EKG  EKG Interpretation None       Radiology No results found.  Procedures .Marland KitchenIncision and Drainage Date/Time: 05/04/2017 3:37 PM Performed by: Bjorn Pippin Authorized by: Bjorn Pippin   Consent:    Consent obtained:  Verbal   Consent given by:  Patient   Risks discussed:  Bleeding Location:    Type:  Cyst   Location:  Upper extremity   Upper extremity location:  Finger   Finger location:  L long finger Pre-procedure details:    Skin preparation:  Chloraprep Anesthesia (see MAR for  exact dosages):    Anesthesia method:  Topical application Procedure details:    Needle aspiration: yes     Needle size:  18 G   Incision types:  Stab incision   Incision depth:  Dermal   Scalpel blade:  15   Drainage:  Serous   Drainage amount:  Scant   Wound treatment:  Wound left open   Packing materials:  None Post-procedure details:    Patient tolerance of procedure:  Tolerated well, no immediate complications   (including critical care time)  Medications Ordered in UC Medications - No data to display   Initial Impression / Assessment and Plan / UC Course  I have reviewed the triage vital signs and the nursing notes.  Pertinent labs & imaging results that were available during my care of the patient were reviewed by me and considered in my medical decision making (see chart for details).   Punctured with 18g after sterile cleaning, cyst material expressed (as stated in procedure) Instructed to use OTC hydrocortisone cream bid x 7 days and keep covered. May f/u with Orthopedic if returns.    Final Clinical Impressions(s) / UC Diagnoses   Final diagnoses:  Mucoid cyst of joint    New Prescriptions New Prescriptions   No medications on file     Controlled Substance Prescriptions [place] Controlled Substance Registry consulted? Not Applicable   Bjorn Pippin, PA-C 05/04/17 1540

## 2017-05-04 NOTE — Discharge Instructions (Signed)
I did not have any information of this mucoid cyst but you can always look it up. It may come back, one way to help prevent would be to try to apply a steroid cream to the area a few times a day. May use hydrocortisone cream otc. If it does return f/u with your orthopedic. Nice to meet you.

## 2017-06-01 DIAGNOSIS — R739 Hyperglycemia, unspecified: Secondary | ICD-10-CM | POA: Diagnosis not present

## 2017-06-01 DIAGNOSIS — I10 Essential (primary) hypertension: Secondary | ICD-10-CM | POA: Diagnosis not present

## 2017-06-04 ENCOUNTER — Other Ambulatory Visit: Payer: Medicare Other

## 2017-06-08 DIAGNOSIS — R739 Hyperglycemia, unspecified: Secondary | ICD-10-CM | POA: Diagnosis not present

## 2017-06-08 DIAGNOSIS — Z0000 Encounter for general adult medical examination without abnormal findings: Secondary | ICD-10-CM | POA: Diagnosis not present

## 2017-06-08 DIAGNOSIS — Z23 Encounter for immunization: Secondary | ICD-10-CM | POA: Diagnosis not present

## 2017-06-11 ENCOUNTER — Ambulatory Visit: Payer: Medicare Other | Admitting: Oncology

## 2017-06-11 ENCOUNTER — Other Ambulatory Visit: Payer: Self-pay

## 2017-06-11 ENCOUNTER — Encounter (INDEPENDENT_AMBULATORY_CARE_PROVIDER_SITE_OTHER): Payer: Self-pay

## 2017-06-11 ENCOUNTER — Encounter: Payer: Self-pay | Admitting: Oncology

## 2017-06-11 ENCOUNTER — Other Ambulatory Visit: Payer: Self-pay | Admitting: Oncology

## 2017-06-11 NOTE — Progress Notes (Signed)
Phlebotomy scheduled Nov 27 @ 1000 AM [facility] Short Stay - pt aware.

## 2017-06-11 NOTE — Patient Instructions (Signed)
Phlebotomy at [facility] short stay on 11/27 Lab check in December to decide if additional phlebotomy needed otherwise repeat lab in 4 months MD visit in 6 months

## 2017-06-11 NOTE — Progress Notes (Signed)
Hematology and Oncology Follow Up Visit  Jessica Conley [id number] 25-Jan-1946 71 y.o. 06/11/2017 6:25 PM   Principle Diagnosis: Encounter Diagnosis  Name Primary?  . Hemochromatosis, hereditary ([place]) Yes  Updated clinical summary: Pleasant 71 year old woman who is a homozygote for the C282Y hemochromatosis gene. Peak ferritin never higher than 461 at time of diagnosis in March of 2012. She had a liver biopsy on 09/09/2010. 4+ iron. Mild periportal inflammation consistent with underlying steatosis. She was initially started on a phlebotomy program which she tolerated poorly but which was successful in rapidly reducing her ferritin into the normal range. Over time and without additional phlebotomy, her ferritin levels have continued to decrease but started to drift up again at time of her visit with me in September 2016 to a value of 155. I put her back on a phlebotomy program. Once again, she had poor tolerance and had a brief syncopal episode at the conclusion of the procedure. She stabilized quickly with saline administration.  I am now limiting phlebotomies to 300 mL's with concomitant administration of 250 mL's of normal saline.  She has mild, chronic, elevation of SGPT liver enzyme with normal SGOT and bilirubin and a low alkaline phosphatase level. Ultrasound of the abdomen done September 2009 showed probable mild diffuse fatty infiltration of the liver without any focal abnormalities.  Interim History: Overall she is doing well.  She has had no interim medical problems.  Review of systems is unremarkable.  She just had a follow-up visit on June 01, 2017 including lab with her primary care physician and she brought me copies.  She has persistent transaminase elevations ALT 81, AST 49 with lab normals up to 32 and 40 respectively.  Ferritin 129.  Hemoglobin 14.6.  MCV 101. Last ferritin recorded in our office was 75 in July.  Unfortunately, her daughter age 34, recently had a  recurrence of breast cancer, now metastatic, and she is on hormonal therapy being treated at [facility].  Medications: reviewed  Allergies:  Allergies  Allergen Reactions  . Wheat     ciliac disease  . Penicillins Rash    Waterblisters     Review of Systems: See interim summary Remaining ROS negative:   Physical Exam: Blood pressure (!) 144/84, pulse 98, temperature 98.1 F (36.7 C), temperature source Oral, height 5' 8"  (1.727 m), weight 154 lb 3.2 oz (69.9 kg), SpO2 96 %. Wt Readings from Last 3 Encounters:  06/11/17 154 lb 3.2 oz (69.9 kg)  02/01/17 150 lb (68 kg)  03/15/16 150 lb (68 kg)     General appearance: Well-nourished Caucasian woman HENNT: Pharynx no erythema, exudate, mass, or ulcer. No thyromegaly or thyroid nodules Lymph nodes: No cervical, supraclavicular, or axillary lymphadenopathy Breasts: Lungs: Clear to auscultation, resonant to percussion throughout Heart: Regular rhythm, no murmur, no gallop, no rub, no click, no edema Abdomen: Soft, nontender, normal bowel sounds, no mass, no organomegaly Extremities: No edema, no calf tenderness Musculoskeletal: no joint deformities GU:  Vascular: Carotid pulses 2+, no bruits, Neurologic: Alert, oriented, PERRLA, cranial nerves grossly normal, motor strength 5 over 5, reflexes 1+ symmetric, upper body coordination normal, gait normal, Skin: No rash or ecchymosis  Lab Results: CBC W/Diff    Component Value Date/Time   WBC 4.9 02/01/2017 0811   RBC 4.07 02/01/2017 0811   HGB 13.9 02/01/2017 0811   HGB 13.9 03/07/2016 1039   HGB 14.7 09/24/2013 0903   HCT 40.8 02/01/2017 0811   HCT 40.8 03/07/2016 1039   HCT 42.7  09/24/2013 0903   PLT 228 02/01/2017 0811   PLT 271 03/07/2016 1039   MCV 100.2 (H) 02/01/2017 0811   MCV 100 (H) 03/07/2016 1039   MCV 99.7 09/24/2013 0903   MCH 34.2 (H) 02/01/2017 0811   MCHC 34.1 02/01/2017 0811   RDW 12.5 02/01/2017 0811   RDW 13.1 03/07/2016 1039   RDW 12.7 09/24/2013 0903    LYMPHSABS 1.9 02/01/2017 0811   LYMPHSABS 1.9 03/07/2016 1039   LYMPHSABS 2.0 09/24/2013 0903   MONOABS 0.5 02/01/2017 0811   MONOABS 0.7 09/24/2013 0903   EOSABS 0.1 02/01/2017 0811   EOSABS 0.1 03/07/2016 1039   BASOSABS 0.0 02/01/2017 0811   BASOSABS 0.0 03/07/2016 1039   BASOSABS 0.0 09/24/2013 0903     Chemistry      Component Value Date/Time   NA 136 02/01/2017 0811   NA 140 03/07/2016 1039   NA 140 09/24/2013 0903   K 3.4 (L) 02/01/2017 0811   K 4.7 09/24/2013 0903   CL 101 02/01/2017 0811   CO2 25 02/01/2017 0811   CO2 30 (H) 09/24/2013 0903   BUN 12 02/01/2017 0811   BUN 13 03/07/2016 1039   BUN 17.0 09/24/2013 0903   CREATININE 0.76 02/01/2017 0811   CREATININE 0.71 10/12/2014 0946   CREATININE 0.9 09/24/2013 0903      Component Value Date/Time   CALCIUM 9.0 02/01/2017 0811   CALCIUM 9.9 09/24/2013 0903   ALKPHOS 36 (L) 02/01/2017 0811   ALKPHOS 46 09/24/2013 0903   AST 52 (H) 02/01/2017 0811   AST 35 (H) 09/24/2013 0903   ALT 76 (H) 02/01/2017 0811   ALT 58 (H) 09/24/2013 0903   BILITOT 0.9 02/01/2017 0811   BILITOT 0.6 03/07/2016 1039   BILITOT 0.54 09/24/2013 0903    Ferritin 129 on November 2   Radiological Studies: No results found.  Impression:  #1.  Homozygote status for the C282Y hemochromatosis gene. Minimal phlebotomy needs.  Current ferritin creeping up.  I will go ahead and schedule a 300 mL phlebotomy after the Thanksgiving holiday and administer 250 cc of normal saline immediately following the phlebotomy.  Check ferritin again in 3 months.  Due to the decreased volume of the phlebotomy we may have to repeat it at that time.  Phlebotomy orders placed.  CC: Patient Care Team: Jani Gravel, MD as PCP - General (Internal Medicine)   Murriel Hopper, MD, [place]  Hematology-Oncology/Internal Medicine     11/12/20186:25 PM

## 2017-06-12 ENCOUNTER — Other Ambulatory Visit: Payer: Self-pay | Admitting: Oncology

## 2017-06-26 ENCOUNTER — Ambulatory Visit (HOSPITAL_COMMUNITY)
Admission: RE | Admit: 2017-06-26 | Discharge: 2017-06-26 | Disposition: A | Discharge status: Home / Self Care | Payer: Medicare Other | Source: Ambulatory Visit | Attending: Oncology | Admitting: Oncology

## 2017-06-26 MED ORDER — SODIUM CHLORIDE 0.9 % IV BOLUS (SEPSIS)
250.0000 mL | Freq: Once | INTRAVENOUS | Status: AC
  Administered 2017-06-26: 250 mL via INTRAVENOUS

## 2017-07-11 ENCOUNTER — Other Ambulatory Visit: Payer: Medicare Other

## 2017-07-17 ENCOUNTER — Other Ambulatory Visit (INDEPENDENT_AMBULATORY_CARE_PROVIDER_SITE_OTHER): Payer: Medicare Other

## 2017-07-18 LAB — FERRITIN: Ferritin: 72 ng/mL (ref 15–150)

## 2017-07-26 ENCOUNTER — Telehealth: Payer: Self-pay | Admitting: *Deleted

## 2017-07-26 NOTE — Telephone Encounter (Signed)
-----   Message from Annia Belt, MD sent at 07/26/2017 10:25 AM EST ----- Call pt: ferritin level stable c/w previous & in good range at 72

## 2017-07-26 NOTE — Telephone Encounter (Signed)
Pt called / informed "ferritin level stable c/w previous & in good range at 72" per Dr Beryle Beams.

## 2017-08-06 DIAGNOSIS — Z961 Presence of intraocular lens: Secondary | ICD-10-CM | POA: Diagnosis not present

## 2017-08-06 DIAGNOSIS — H26493 Other secondary cataract, bilateral: Secondary | ICD-10-CM | POA: Diagnosis not present

## 2017-08-06 DIAGNOSIS — H35373 Puckering of macula, bilateral: Secondary | ICD-10-CM | POA: Diagnosis not present

## 2017-09-11 ENCOUNTER — Other Ambulatory Visit: Payer: Medicare Other

## 2017-09-13 ENCOUNTER — Other Ambulatory Visit: Payer: Medicare Other

## 2017-09-13 ENCOUNTER — Other Ambulatory Visit (INDEPENDENT_AMBULATORY_CARE_PROVIDER_SITE_OTHER): Payer: Medicare Other

## 2017-09-14 ENCOUNTER — Telehealth: Payer: Self-pay | Admitting: *Deleted

## 2017-09-14 LAB — FERRITIN: Ferritin: 67 ng/mL (ref 15–150)

## 2017-09-14 NOTE — Telephone Encounter (Signed)
Called pt - no answer; left message" ferritin in good range at 67: we can continue to watch without another phlebotomy" per Dr Beryle Beams. And call for any questions.

## 2017-09-14 NOTE — Telephone Encounter (Signed)
-----   Message from Annia Belt, MD sent at 09/14/2017  9:04 AM EST ----- Call pt: ferritin in good range at 67: we can continue to watch without another phlebotomy

## 2017-12-06 DIAGNOSIS — Z0000 Encounter for general adult medical examination without abnormal findings: Secondary | ICD-10-CM | POA: Diagnosis not present

## 2017-12-06 DIAGNOSIS — R739 Hyperglycemia, unspecified: Secondary | ICD-10-CM | POA: Diagnosis not present

## 2017-12-10 ENCOUNTER — Ambulatory Visit: Payer: Medicare Other | Admitting: Oncology

## 2017-12-13 DIAGNOSIS — L03019 Cellulitis of unspecified finger: Secondary | ICD-10-CM | POA: Diagnosis not present

## 2017-12-13 DIAGNOSIS — K219 Gastro-esophageal reflux disease without esophagitis: Secondary | ICD-10-CM | POA: Diagnosis not present

## 2017-12-13 DIAGNOSIS — Z0000 Encounter for general adult medical examination without abnormal findings: Secondary | ICD-10-CM | POA: Diagnosis not present

## 2017-12-13 DIAGNOSIS — E7800 Pure hypercholesterolemia, unspecified: Secondary | ICD-10-CM | POA: Diagnosis not present

## 2017-12-13 DIAGNOSIS — R739 Hyperglycemia, unspecified: Secondary | ICD-10-CM | POA: Diagnosis not present

## 2017-12-13 DIAGNOSIS — I10 Essential (primary) hypertension: Secondary | ICD-10-CM | POA: Diagnosis not present

## 2017-12-17 ENCOUNTER — Ambulatory Visit: Payer: Medicare Other | Admitting: Oncology

## 2017-12-17 ENCOUNTER — Other Ambulatory Visit: Payer: Self-pay

## 2017-12-17 ENCOUNTER — Encounter: Payer: Self-pay | Admitting: Oncology

## 2017-12-17 DIAGNOSIS — K760 Fatty (change of) liver, not elsewhere classified: Secondary | ICD-10-CM

## 2017-12-17 DIAGNOSIS — K900 Celiac disease: Secondary | ICD-10-CM | POA: Diagnosis not present

## 2017-12-17 DIAGNOSIS — R011 Cardiac murmur, unspecified: Secondary | ICD-10-CM

## 2017-12-17 DIAGNOSIS — L089 Local infection of the skin and subcutaneous tissue, unspecified: Secondary | ICD-10-CM | POA: Diagnosis not present

## 2017-12-17 DIAGNOSIS — Z880 Allergy status to penicillin: Secondary | ICD-10-CM | POA: Diagnosis not present

## 2017-12-17 NOTE — Patient Instructions (Signed)
No lab today Return visit 6 months Lab 1 week before visit

## 2017-12-17 NOTE — Progress Notes (Signed)
Hematology and Oncology Follow Up Visit  Jessica Conley [id number] 08-Mar-1946 72 y.o. 12/17/2017 10:06 AM   Principle Diagnosis: Encounter Diagnosis  Name Primary?  . Hemochromatosis, hereditary ([place]) Yes  Clinical summary: Pleasant 72 year old woman who is a homozygote for the C282Y hemochromatosis gene. Peak ferritin never higher than 461 at time of diagnosis in March of 2012. She had a liver biopsy on 09/09/2010. 4+ iron. Mild periportal inflammation consistent with underlying steatosis. She was initially started on a phlebotomy program which she tolerated poorly but which was successful in rapidly reducing her ferritin into the normal range. Over time and without additional phlebotomy, her ferritin levels have continued to decrease but started to drift up again at time of her visit with me in September 2016 to a value of 155. I put her back on a phlebotomy program. Once again, she had poor tolerance and had a brief syncopal episode at the conclusion of the procedure. She stabilized quickly with saline administration.  I am now limiting phlebotomies to 300 mL's with concomitant administration of 250 mL's of normal saline.  She has mild, chronic, elevation of SGPT liver enzyme with normal SGOT and bilirubin and a low alkaline phosphatase level. Ultrasound of the abdomen done September 2009 showed probable mild diffuse fatty infiltration of the liver without any focal abnormalities.  Interim History:  Overall doing well.  No interim medical problems.  She does have a active infection of the cuticle index finger left hand being treated.  She just saw her primary care physician last week.  She brought a copy of labs done through his office on May 9.  Ferritin remains in good range at 106.  Liver functions normal except borderline elevation ALT 37 (0-32).  Hemoglobin 14, hematocrit 41. Her daughter now 45 has had a metastatic recurrence of breast cancer diagnosed at age 25.  Currently in a  good partial remission on treatment.  She is seeing Dr. Janan Halter at [facility] She has a son and another daughter are both healthy.  Both [place] of state. Health maintenance exams being done by her primary care physician and her gynecologist. Medications: reviewed  Allergies:  Allergies  Allergen Reactions  . Wheat     ciliac disease  . Penicillins Rash    Waterblisters     Review of Systems: See interim history Remaining ROS negative:   Physical Exam: Blood pressure 136/85, pulse 78, temperature 97.6 F (36.4 C), temperature source Oral, height 5' 8"  (1.727 m), weight 152 lb 1.6 oz (69 kg), SpO2 97 %. Wt Readings from Last 3 Encounters:  12/17/17 152 lb 1.6 oz (69 kg)  06/11/17 154 lb 3.2 oz (69.9 kg)  02/01/17 150 lb (68 kg)     General appearance: Tall well-nourished Caucasian woman HENNT: Pharynx no erythema, exudate, mass, or ulcer. No thyromegaly or thyroid nodules Lymph nodes: No cervical, supraclavicular, or axillary lymphadenopathy Breasts: Lungs: Clear to auscultation, resonant to percussion throughout Heart: Regular rhythm, 2/6 aortic systolic murmur, no gallop, no rub, no click, no edema Abdomen: Soft, nontender, normal bowel sounds, no mass, no organomegaly Extremities: No edema, no calf tenderness Musculoskeletal: no joint deformities GU:  Vascular: Carotid pulses 2+, no bruits, Neurologic: Alert, oriented, PERRLA,, cranial nerves grossly normal, motor strength 5 over 5, reflexes 1+ symmetric, upper body coordination normal, gait normal, Skin: No rash or ecchymosis  Lab Results: CBC W/Diff    Component Value Date/Time   WBC 4.9 02/01/2017 0811   RBC 4.07 02/01/2017 0811  HGB 13.9 02/01/2017 0811   HGB 13.9 03/07/2016 1039   HGB 14.7 09/24/2013 0903   HCT 40.8 02/01/2017 0811   HCT 40.8 03/07/2016 1039   HCT 42.7 09/24/2013 0903   PLT 228 02/01/2017 0811   PLT 271 03/07/2016 1039   MCV 100.2 (H) 02/01/2017 0811   MCV 100 (H) 03/07/2016 1039   MCV  99.7 09/24/2013 0903   MCH 34.2 (H) 02/01/2017 0811   MCHC 34.1 02/01/2017 0811   RDW 12.5 02/01/2017 0811   RDW 13.1 03/07/2016 1039   RDW 12.7 09/24/2013 0903   LYMPHSABS 1.9 02/01/2017 0811   LYMPHSABS 1.9 03/07/2016 1039   LYMPHSABS 2.0 09/24/2013 0903   MONOABS 0.5 02/01/2017 0811   MONOABS 0.7 09/24/2013 0903   EOSABS 0.1 02/01/2017 0811   EOSABS 0.1 03/07/2016 1039   BASOSABS 0.0 02/01/2017 0811   BASOSABS 0.0 03/07/2016 1039   BASOSABS 0.0 09/24/2013 0903     Chemistry      Component Value Date/Time   NA 136 02/01/2017 0811   NA 140 03/07/2016 1039   NA 140 09/24/2013 0903   K 3.4 (L) 02/01/2017 0811   K 4.7 09/24/2013 0903   CL 101 02/01/2017 0811   CO2 25 02/01/2017 0811   CO2 30 (H) 09/24/2013 0903   BUN 12 02/01/2017 0811   BUN 13 03/07/2016 1039   BUN 17.0 09/24/2013 0903   CREATININE 0.76 02/01/2017 0811   CREATININE 0.71 10/12/2014 0946   CREATININE 0.9 09/24/2013 0903      Component Value Date/Time   CALCIUM 9.0 02/01/2017 0811   CALCIUM 9.9 09/24/2013 0903   ALKPHOS 36 (L) 02/01/2017 0811   ALKPHOS 46 09/24/2013 0903   AST 52 (H) 02/01/2017 0811   AST 35 (H) 09/24/2013 0903   ALT 76 (H) 02/01/2017 0811   ALT 58 (H) 09/24/2013 0903   BILITOT 0.9 02/01/2017 0811   BILITOT 0.6 03/07/2016 1039   BILITOT 0.54 09/24/2013 2197       Radiological Studies: No results found.  Impression:    Homozygous status for the C282Y hemochromatosis gene. Minimal phlebotomy requirements over time.  No evidence for clinical hemochromatosis at any time. Plan continue to monitor ferritin on a every six-month basis.  Limited 300 mL volume phlebotomy only if persistent elevation of ferritin above 150. I told her I would be retiring next spring.  I will see her one more time then make arrangements for annual follow-up at the cancer center with a another hematologist.  CC: Patient Care Team: Jani Gravel, MD as PCP - General (Internal Medicine)   Murriel Hopper,  MD, FACP  Hematology-Oncology/Internal Medicine     5/20/201910:06 AM

## 2018-01-08 DIAGNOSIS — Z1231 Encounter for screening mammogram for malignant neoplasm of breast: Secondary | ICD-10-CM | POA: Diagnosis not present

## 2018-01-15 DIAGNOSIS — M67442 Ganglion, left hand: Secondary | ICD-10-CM | POA: Diagnosis not present

## 2018-01-15 DIAGNOSIS — M79645 Pain in left finger(s): Secondary | ICD-10-CM | POA: Insufficient documentation

## 2018-05-03 ENCOUNTER — Other Ambulatory Visit: Payer: Self-pay | Admitting: Oncology

## 2018-05-10 DIAGNOSIS — Z23 Encounter for immunization: Secondary | ICD-10-CM | POA: Diagnosis not present

## 2018-06-03 ENCOUNTER — Other Ambulatory Visit: Payer: Medicare Other

## 2018-06-03 DIAGNOSIS — R739 Hyperglycemia, unspecified: Secondary | ICD-10-CM | POA: Diagnosis not present

## 2018-06-03 DIAGNOSIS — I10 Essential (primary) hypertension: Secondary | ICD-10-CM | POA: Diagnosis not present

## 2018-06-03 DIAGNOSIS — Z0000 Encounter for general adult medical examination without abnormal findings: Secondary | ICD-10-CM | POA: Diagnosis not present

## 2018-06-10 DIAGNOSIS — Z0000 Encounter for general adult medical examination without abnormal findings: Secondary | ICD-10-CM | POA: Diagnosis not present

## 2018-06-10 DIAGNOSIS — I10 Essential (primary) hypertension: Secondary | ICD-10-CM | POA: Diagnosis not present

## 2018-06-10 DIAGNOSIS — E7800 Pure hypercholesterolemia, unspecified: Secondary | ICD-10-CM | POA: Diagnosis not present

## 2018-06-11 ENCOUNTER — Ambulatory Visit: Payer: Medicare Other | Admitting: Oncology

## 2018-06-11 ENCOUNTER — Encounter: Payer: Self-pay | Admitting: Oncology

## 2018-06-11 ENCOUNTER — Other Ambulatory Visit: Payer: Self-pay

## 2018-06-11 DIAGNOSIS — Z880 Allergy status to penicillin: Secondary | ICD-10-CM | POA: Diagnosis not present

## 2018-06-11 DIAGNOSIS — K760 Fatty (change of) liver, not elsewhere classified: Secondary | ICD-10-CM | POA: Diagnosis not present

## 2018-06-11 DIAGNOSIS — K900 Celiac disease: Secondary | ICD-10-CM | POA: Diagnosis not present

## 2018-06-11 NOTE — Progress Notes (Signed)
Phlebotomy appt scheduled next Tuesday 11/19 @ 0900 AM at [place]; pt informed.

## 2018-06-11 NOTE — Patient Instructions (Signed)
Phlebotomy at [facility] short stay on Tuesday 06/18/18 Return for lab check on 08/12/18 Follow up with Dr Darnell Level as needed before March, 2020  We will transition your Hematology care to one of the Hematologists at [place] after the new year.  It has been a pleasure working with you!

## 2018-06-12 NOTE — Progress Notes (Signed)
Hematology and Oncology Follow Up Visit  Jessica Conley [id number] 1946/02/01 72 y.o. 06/12/2018 9:48 AM   Principle Diagnosis: Encounter Diagnosis  Name Primary?  . Hemochromatosis, hereditary ([place]) Yes  Clinical summary: Pleasant 72 year old woman who is a homozygote for the C282Y hemochromatosis gene. Peak ferritin never higher than 461 at time of diagnosis in March of 2012. She had a liver biopsy on 09/09/2010. 4+ iron. Mild periportal inflammation consistent with underlying steatosis. She was initially started on a phlebotomy program which she tolerated poorly but which was successful in rapidly reducing her ferritin into the normal range. Over time and without additional phlebotomy, her ferritin levels have continued to decrease but started to drift up again at time of her visit with me in September 2016 to a value of 155. I put her back on a phlebotomy program. Once again, she had poor tolerance and had a brief syncopal episode at the conclusion of the procedure. She stabilized quickly with saline administration.  I am now limiting phlebotomies to 300 mL's with concomitant administration of 250 mL's of normal saline.  She has mild, chronic, elevation of SGPT liver enzyme with normal SGOT and bilirubin and a low alkaline phosphatase level. Ultrasound of the abdomen done September 2009 showed probable mild diffuse fatty infiltration of the liver without any focal abnormalities.   Interim History: She continues to do well.  No interim medical problems.  Most recent ferritin recorded on September 13, 2017 was 67.  She brought in labs done yesterday November 11 by her primary care physician and ferritin was borderline elevated at 165.  Liver functions normal AST 38, mild elevation of ALT 57 (0-32). Her primary care physician is on medical leave but she is seeing a substitute physician.  Her 16 year old daughter has metastatic recurrence of breast cancer.  It sounds like she has  progressed through to hormonal therapies and is now getting Tax[facility] chemotherapy at [facility].  Medications: reviewed  Allergies:  Allergies  Allergen Reactions  . Wheat     ciliac disease  . Penicillins Rash    Waterblisters     Review of Systems: See interim history Remaining ROS negative:   Physical Exam: Blood pressure (!) 158/92, pulse 76, temperature 98 F (36.7 C), temperature source Oral, height 5' 8"  (1.727 m), weight 152 lb 11.2 oz (69.3 kg), SpO2 97 %. Wt Readings from Last 3 Encounters:  06/11/18 152 lb 11.2 oz (69.3 kg)  12/17/17 152 lb 1.6 oz (69 kg)  06/11/17 154 lb 3.2 oz (69.9 kg)     General appearance: Thin well-nourished Caucasian woman HENNT: Pharynx no erythema, exudate, mass, or ulcer. No thyromegaly or thyroid nodules Lymph nodes: No cervical, supraclavicular, or axillary lymphadenopathy Breasts: Lungs: Clear to auscultation, resonant to percussion throughout Heart: Regular rhythm, no murmur, no gallop, no rub, no click, no edema Abdomen: Soft, nontender, normal bowel sounds, no mass, no organomegaly Extremities: No edema, no calf tenderness Musculoskeletal: no joint deformities GU:  Vascular: Carotid pulses 2+, no bruits, distal pulses: Dorsalis pedis 1+ symmetric Neurologic: Alert, oriented, PERRLA, , cranial nerves grossly normal, motor strength 5 over 5, reflexes 1+ symmetric, upper body coordination normal, gait normal, Skin: No rash or ecchymosis  Lab Results: CBC W/Diff    Component Value Date/Time   WBC 4.9 02/01/2017 0811   RBC 4.07 02/01/2017 0811   HGB 13.9 02/01/2017 0811   HGB 13.9 03/07/2016 1039   HGB 14.7 09/24/2013 0903   HCT 40.8 02/01/2017 0811  HCT 40.8 03/07/2016 1039   HCT 42.7 09/24/2013 0903   PLT 228 02/01/2017 0811   PLT 271 03/07/2016 1039   MCV 100.2 (H) 02/01/2017 0811   MCV 100 (H) 03/07/2016 1039   MCV 99.7 09/24/2013 0903   MCH 34.2 (H) 02/01/2017 0811   MCHC 34.1 02/01/2017 0811   RDW  12.5 02/01/2017 0811   RDW 13.1 03/07/2016 1039   RDW 12.7 09/24/2013 0903   LYMPHSABS 1.9 02/01/2017 0811   LYMPHSABS 1.9 03/07/2016 1039   LYMPHSABS 2.0 09/24/2013 0903   MONOABS 0.5 02/01/2017 0811   MONOABS 0.7 09/24/2013 0903   EOSABS 0.1 02/01/2017 0811   EOSABS 0.1 03/07/2016 1039   BASOSABS 0.0 02/01/2017 0811   BASOSABS 0.0 03/07/2016 1039   BASOSABS 0.0 09/24/2013 0903     Chemistry      Component Value Date/Time   NA 136 02/01/2017 0811   NA 140 03/07/2016 1039   NA 140 09/24/2013 0903   K 3.4 (L) 02/01/2017 0811   K 4.7 09/24/2013 0903   CL 101 02/01/2017 0811   CO2 25 02/01/2017 0811   CO2 30 (H) 09/24/2013 0903   BUN 12 02/01/2017 0811   BUN 13 03/07/2016 1039   BUN 17.0 09/24/2013 0903   CREATININE 0.76 02/01/2017 0811   CREATININE 0.71 10/12/2014 0946   CREATININE 0.9 09/24/2013 0903      Component Value Date/Time   CALCIUM 9.0 02/01/2017 0811   CALCIUM 9.9 09/24/2013 0903   ALKPHOS 36 (L) 02/01/2017 0811   ALKPHOS 46 09/24/2013 0903   AST 52 (H) 02/01/2017 0811   AST 35 (H) 09/24/2013 0903   ALT 76 (H) 02/01/2017 0811   ALT 58 (H) 09/24/2013 0903   BILITOT 0.9 02/01/2017 0811   BILITOT 0.6 03/07/2016 1039   BILITOT 0.54 09/24/2013 2111       Radiological Studies: No results found.  Impression:    Homozygote status for the C282Y hemochromatosis gene Incomplete penetrance only requiring rare phlebotomy. I will go ahead and schedule a 300 mL phlebotomy with concomitant saline administration.  Orders placed.  Repeat lab in 1 month to assess ferritin level.  I will transition her hematology care to 1 of the doctors at the [facility] lung cancer center after the new year.  CC: Patient Care Team: Jani Gravel, MD as PCP - General (Internal Medicine)   Murriel Hopper, MD, [place]  Hematology-Oncology/Internal Medicine     11/13/20199:48 AM

## 2018-06-18 ENCOUNTER — Ambulatory Visit (HOSPITAL_COMMUNITY)
Admission: RE | Admit: 2018-06-18 | Discharge: 2018-06-18 | Disposition: A | Discharge status: Home / Self Care | Payer: Medicare Other | Source: Ambulatory Visit | Attending: Oncology | Admitting: Oncology

## 2018-06-18 MED ORDER — SODIUM CHLORIDE 0.9 % IV SOLN
INTRAVENOUS | Status: DC
  Administered 2018-06-18: 09:00:00 via INTRAVENOUS

## 2018-06-18 NOTE — Progress Notes (Signed)
Pt continues to feel well. Requesting to leave. Tolerated position changes without dizziness. VSS. IV fluid stopped, IV removed and pt DC home at 1010.

## 2018-06-18 NOTE — Progress Notes (Signed)
Pt here for removal of 300 ml blood via therapeutic phlebotomy. Removed from left antecubital area. IV NS given prior, during and after phlebotomy as ordered. Pt tolerated well. VSS.

## 2018-08-12 ENCOUNTER — Other Ambulatory Visit (INDEPENDENT_AMBULATORY_CARE_PROVIDER_SITE_OTHER): Payer: Medicare Other

## 2018-08-12 ENCOUNTER — Encounter (INDEPENDENT_AMBULATORY_CARE_PROVIDER_SITE_OTHER): Payer: Self-pay

## 2018-08-13 LAB — CBC WITH DIFFERENTIAL/PLATELET
Basophils Absolute: 0 10*3/uL (ref 0.0–0.2)
Basos: 1 %
EOS (ABSOLUTE): 0.1 10*3/uL (ref 0.0–0.4)
Eos: 2 %
Hematocrit: 39.9 % (ref 34.0–46.6)
Hemoglobin: 13.7 g/dL (ref 11.1–15.9)
Immature Grans (Abs): 0 10*3/uL (ref 0.0–0.1)
Immature Granulocytes: 0 %
LYMPHS ABS: 1.9 10*3/uL (ref 0.7–3.1)
Lymphs: 30 %
MCH: 35.5 pg — ABNORMAL HIGH (ref 26.6–33.0)
MCHC: 34.3 g/dL (ref 31.5–35.7)
MCV: 103 fL — ABNORMAL HIGH (ref 79–97)
Monocytes Absolute: 0.5 10*3/uL (ref 0.1–0.9)
Monocytes: 8 %
Neutrophils Absolute: 3.6 10*3/uL (ref 1.4–7.0)
Neutrophils: 59 %
Platelets: 284 10*3/uL (ref 150–450)
RBC: 3.86 x10E6/uL (ref 3.77–5.28)
RDW: 11.5 % — ABNORMAL LOW (ref 11.7–15.4)
WBC: 6.1 10*3/uL (ref 3.4–10.8)

## 2018-08-13 LAB — FERRITIN: Ferritin: 108 ng/mL (ref 15–150)

## 2018-08-14 ENCOUNTER — Telehealth: Payer: Self-pay | Admitting: *Deleted

## 2018-08-14 NOTE — Telephone Encounter (Signed)
Noted. Thanks.

## 2018-08-14 NOTE — Telephone Encounter (Signed)
-----   Message from Annia Belt, MD sent at 08/13/2018 10:27 AM EST ----- Call pt: ferritin 108 up from 67 1 year ago. This is in acceptable range. Ordinarily, I would wait 6 months to check again but since I am retiring, we can check again in March unless she wants to wait until she gets established with new Hematologist. Her call. Let me know.

## 2018-08-14 NOTE — Telephone Encounter (Signed)
Pt called / informed ferritin is 108 up from 67 about one yr ago;"this is in acceptable range" per Dr Beryle Beams. She stated she will prefer to wait until she sees her new doctor for labs. Stated she has not heard from the cancer ctr; telephone # given to her (ask for Seth Bake).

## 2018-08-29 DIAGNOSIS — H40013 Open angle with borderline findings, low risk, bilateral: Secondary | ICD-10-CM | POA: Diagnosis not present

## 2018-08-29 DIAGNOSIS — H26492 Other secondary cataract, left eye: Secondary | ICD-10-CM | POA: Diagnosis not present

## 2018-08-29 DIAGNOSIS — H43813 Vitreous degeneration, bilateral: Secondary | ICD-10-CM | POA: Diagnosis not present

## 2018-08-29 DIAGNOSIS — H26491 Other secondary cataract, right eye: Secondary | ICD-10-CM | POA: Diagnosis not present

## 2018-09-24 ENCOUNTER — Telehealth: Payer: Self-pay | Admitting: *Deleted

## 2018-09-24 ENCOUNTER — Telehealth: Payer: Self-pay | Admitting: Hematology

## 2018-09-24 ENCOUNTER — Encounter: Payer: Self-pay | Admitting: Hematology

## 2018-09-24 NOTE — Telephone Encounter (Signed)
That is fine if Dr Maudie Mercury is willing & able to order phlebotomies when needed.  Let her know they are backed up calling for appts at the cancer center - she can call them to push the issue. [phone]

## 2018-09-24 NOTE — Telephone Encounter (Signed)
Called pt - no answer; left message to call me back.

## 2018-09-24 NOTE — Telephone Encounter (Signed)
A new hem appt has been scheduled for the pt to see Dr. Burr Medico on 3/19 at 915am w/labs at 845am. Letter mailed to the pt.

## 2018-09-24 NOTE — Telephone Encounter (Signed)
Call from pt - stated since she has not heard from the cancer ctr yet about appt w/ new doctor; she wants to know if her PCP, Dr Maudie Mercury, could manage her hemochromatosis?

## 2018-09-25 NOTE — Telephone Encounter (Addendum)
Pt informed "That is fine if Dr Maudie Mercury is willing & able to order phlebotomies when needed." per Dr Beryle Beams., Stated she will ask her primary doctor. Also informed if not,to call the cancer ctr to schedule an appt w/her new doctor. Verbalized understanding.

## 2018-09-25 NOTE — Telephone Encounter (Signed)
Noted Thanks DrG

## 2018-10-07 DIAGNOSIS — Z0120 Encounter for dental examination and cleaning without abnormal findings: Secondary | ICD-10-CM | POA: Diagnosis not present

## 2018-10-14 ENCOUNTER — Telehealth: Payer: Self-pay | Admitting: Hematology

## 2018-10-14 NOTE — Telephone Encounter (Signed)
Pt lft a vm on 3/13 to reschedule her 3/19 appt. I cld both her home and mobile to reschedule. Lft a vm on both phone numbers.

## 2018-10-14 NOTE — Telephone Encounter (Signed)
Pt cld back to reschedule her hem appt w/Dr. Burr Medico. A new appt has been scheduled on 3/30 at 215pm w/labs at 145pm.

## 2018-10-17 ENCOUNTER — Other Ambulatory Visit: Payer: Medicare Other

## 2018-10-17 ENCOUNTER — Encounter: Payer: Medicare Other | Admitting: Hematology

## 2018-10-21 ENCOUNTER — Encounter: Payer: Self-pay | Admitting: *Deleted

## 2018-10-25 ENCOUNTER — Telehealth: Payer: Self-pay | Admitting: Hematology

## 2018-10-25 ENCOUNTER — Other Ambulatory Visit: Payer: Self-pay

## 2018-10-25 NOTE — Telephone Encounter (Signed)
Pt's new hem appt has been rescheduled to see Dr. Burr Medico on 5/18 at 10:15am with labs at 9:45am.

## 2018-10-28 ENCOUNTER — Other Ambulatory Visit: Payer: Medicare Other

## 2018-10-28 ENCOUNTER — Encounter: Payer: Medicare Other | Admitting: Hematology

## 2018-12-13 NOTE — Progress Notes (Signed)
Jessica Conley   Telephone:([phone] Fax:(336) [place] Note   Patient Care Team: Jani Gravel, MD as PCP - General (Internal Medicine)  Date of Service:  12/16/2018   CHIEF COMPLAINTS/PURPOSE OF CONSULTATION:  Hereditary Hemochromatosis  REFERRING PHYSICIAN:  Dr Beryle Beams  Hematology Per Dr Beryle Beams  "Peak ferritin never higher than 461 at time of diagnosis in March of 2012. She had a liver biopsy on 09/09/2010. 4+ iron. Mild periportal inflammation consistent with underlying steatosis. She was initially started on a phlebotomy program which she tolerated poorly but which was successful in rapidly reducing her ferritin into the normal range. Over time and without additional phlebotomy, her ferritin levels have continued to decrease but started to drift up again at time of her visit with me in September 2016 to a value of 155. I put her back on a phlebotomy program. Once again, she had poor tolerance and had a brief syncopal episode at the conclusion of the procedure. She stabilized quickly with saline administration.I am now limiting phlebotomies to 300 mL's with concomitant administration of 250 mL's of normal saline."  HISTORY OF PRESENTING ILLNESS:  Jessica Conley 73 y.o. female is a here because of hereditary hemochromatosis. The patient was previously under the care of Dr Beryle Beams and has transferred her care to me. The patient presents to the clinic today alone.   She notes with initial diagnoses she had phlebotomy often. In recent years she has it about twice a year. She would have reaction with Phlebotomy where she fainted. She notes she required IV Fluids. Her last phlebotomy was in 2019. She cancelled her March follow up due to COVID-19.  She denies family history of Hemochromatosis as far as she knows. She notes she has a twin sister who was not checked for mutation but she had recently had 500 range Ferritin and is being  followed by hematologist. She had 3 children but her daughter died from breast cancer recently. She was not checked for hemochromatosis. Her other 2 children are negative for this.   Socially she is married with 3 children. 1 of which passes recently. She drinks white wine less than 1 glass a day. She never smoked.  They have a PMHx of elevated BP. She is borderline diabetic. She had a hysterectomy. Her daughter had breast cancer, no other family history of cancer. She notes her GI did not recommend she have another colonoscopy given she will be 73 yo at that time. She continues to have mammograms yearly.     REVIEW OF SYSTEMS:   Constitutional: Denies fevers, chills or abnormal night sweats Eyes: Denies blurriness of vision, double vision or watery eyes Ears, nose, mouth, throat, and face: Denies mucositis or sore throat Respiratory: Denies cough, dyspnea or wheezes Cardiovascular: Denies palpitation, chest discomfort or lower extremity swelling Gastrointestinal:  Denies nausea, heartburn or change in bowel habits Skin: Denies abnormal skin rashes Lymphatics: Denies new lymphadenopathy or easy bruising Neurological:Denies numbness, tingling or new weaknesses Behavioral/Psych: Mood is stable, no new changes  All other systems were reviewed with the patient and are negative.   MEDICAL HISTORY:  Past Medical History:  Diagnosis Date  . Celiac sprue   . Hemochromatosis, hereditary ([place]) 05/18/2010  . Hypertension     SURGICAL HISTORY: Past Surgical History:  Procedure Laterality Date  . EYE SURGERY    . PHLEBOTOMY THERAPEUTIC  08/21/2011      . VAGINAL HYSTERECTOMY  early age 45s   prolapse  SOCIAL HISTORY: Social History   Socioeconomic History  . Marital status: Married    Spouse name: Not on file  . Number of children: Not on file  . Years of education: Not on file  . Highest education level: Not on file  Occupational History  . Not on file  Social Needs  .  Financial resource strain: Not on file  . Food insecurity:    Worry: Not on file    Inability: Not on file  . Transportation needs:    Medical: Not on file    Non-medical: Not on file  Tobacco Use  . Smoking status: Never Smoker  . Smokeless tobacco: Never Used  Substance and Sexual Activity  . Alcohol use: Yes    Alcohol/week: 4.0 standard drinks    Types: 4 Glasses of wine per week    Comment: Wine  . Drug use: No  . Sexual activity: Not on file  Lifestyle  . Physical activity:    Days per week: Not on file    Minutes per session: Not on file  . Stress: Not on file  Relationships  . Social connections:    Talks on phone: Not on file    Gets together: Not on file    Attends religious service: Not on file    Active member of club or organization: Not on file    Attends meetings of clubs or organizations: Not on file    Relationship status: Not on file  . Intimate partner violence:    Fear of current or ex partner: Not on file    Emotionally abused: Not on file    Physically abused: Not on file    Forced sexual activity: Not on file  Other Topics Concern  . Not on file  Social History Narrative  . Not on file    FAMILY HISTORY: Family History  Problem Relation Age of Onset  . Heart attack Mother   . Melanoma Father   . Breast cancer Daughter 49    ALLERGIES:  is allergic to wheat and penicillins.  MEDICATIONS:  Current Outpatient Medications  Medication Sig Dispense Refill  . FOLIC ACID PO Take 1 tablet by mouth daily.    Marland Kitchen losartan-hydrochlorothiazide (HYZAAR) 100-25 MG per tablet Take 1 tablet by mouth daily.    . metFORMIN (GLUCOPHAGE) 500 MG tablet Take 500 mg by mouth daily.    . Multiple Vitamin (MULITIVITAMIN WITH MINERALS) TABS Take 1 tablet by mouth daily.    . pravastatin (PRAVACHOL) 20 MG tablet Take 40 mg by mouth daily.      No current facility-administered medications for this visit.     PHYSICAL EXAMINATION: ECOG PERFORMANCE STATUS: 1 -  Symptomatic but completely ambulatory  Vitals:   12/16/18 1013  BP: (!) 146/94  Pulse: 84  Resp: 18  Temp: 98 F (36.7 C)  SpO2: 98%   Filed Weights   12/16/18 1013  Weight: 150 lb 11.2 oz (68.4 kg)    GENERAL:alert, no distress and comfortable SKIN: skin color, texture, turgor are normal, no rashes or significant lesions EYES: normal, conjunctiva are pink and non-injected, sclera clear OROPHARYNX:no exudate, no erythema and lips, buccal mucosa, and tongue normal  NECK: supple, thyroid normal size, non-tender, without nodularity LYMPH:  no palpable lymphadenopathy in the cervical, axillary or inguinal LUNGS: clear to auscultation and percussion with normal breathing effort HEART: regular rate & rhythm and no murmurs and no lower extremity edema ABDOMEN:abdomen soft, non-tender and normal bowel sounds Musculoskeletal:no cyanosis  of digits and no clubbing  PSYCH: alert & oriented x 3 with fluent speech NEURO: no focal motor/sensory deficits  LABORATORY DATA:  I have reviewed the data as listed CBC Latest Ref Rng & Units 12/16/2018 08/12/2018 02/01/2017  WBC 4.0 - 10.5 K/uL 5.3 6.1 4.9  Hemoglobin 12.0 - 15.0 g/dL 14.6 13.7 13.9  Hematocrit 36.0 - 46.0 % 42.6 39.9 40.8  Platelets 150 - 400 K/uL 235 284 228    CMP Latest Ref Rng & Units 12/16/2018 02/01/2017 07/14/2016  Glucose 70 - 99 mg/dL 136(H) 152(H) 114(H)  BUN 8 - 23 mg/dL 11 12 11   Creatinine 0.44 - 1.00 mg/dL 0.79 0.76 0.82  Sodium 135 - 145 mmol/L 139 136 137  Potassium 3.5 - 5.1 mmol/L 3.3(L) 3.4(L) 3.5  Chloride 98 - 111 mmol/L 103 101 103  CO2 22 - 32 mmol/L 26 25 24   Calcium 8.9 - 10.3 mg/dL 9.1 9.0 9.3  Total Protein 6.5 - 8.1 g/dL 7.4 6.8 7.1  Total Bilirubin 0.3 - 1.2 mg/dL 0.6 0.9 0.9  Alkaline Phos 38 - 126 U/L 40 36(L) 36(L)  AST 15 - 41 U/L 37 52(H) 39  ALT 0 - 44 U/L 71(H) 76(H) 48     RADIOGRAPHIC STUDIES: I have personally reviewed the radiological images as listed and agreed with the findings in  the report. No results found.  ASSESSMENT & PLAN:  Jessica Conley is a 73 y.o. caucasian female with a history of elevated BP and borderline Diabetic.    1. Hereditary hemochromatosis, homozygote C282Y -Diagnosed in 09/2010. Peak ferritin 461 at time of diagnosis. -She had a liver biopsy on 09/09/2010. 4+ iron. Mild periportal inflammation consistent with underlying steatosis.  -Dr Riki Sheer restarted her on phlebotomies. She had poor tolerance and had a brief syncopal episode. She stabilized quickly with saline administration.Dr. Darnell Level recommended to keep her ferritin below 150 -I discussed continuing phlebotomy treatment with goal of Ferritin below 100 due to her age and poor tolerance (typical goal is 35). Will give IV Fluids with each phlebotomy and I strongly encouraged she eat and drink plenty of fluids beforehand. She is agreeable.  -Will check her liver with [place] every 2 years to monitor for iron overload which can lead to liver cirrhosis and other complications. Plan for liver US in 06/2019.  -I reviewed her family history. She had 3 children, 1 deceased and the other 2 were tested negative for hemochromatosis. I discussed she should encourage her children to monitor their iron level.  -She is clinically doing well. Physical exam normal today. Labs reviewed today, CBC WNL except MCV 101.2, CMP WNL except ALT 71. Iron panel still pending. Will proceed with phlebotomy today.  -I encouraged her to avoid supplements with iron. -Given her levels have been controlled overall will do labs every 6 months, F/u in 1 year.   2. HTN, Borderline DM  -On Hyzaar, Metformin  3. Cancer Screening  -I encouraged her to continue age appropriate cancer screenings  -She will continue to yearly mammograms -By her next coloscopy she will be 78, GI did not recommend she continue screening.    PLAN:  -Proceed with Phlebotomy today with 316m blood removed, and NS 507minfusion  -Lab in 6 month -F/u  and phlebotomy in 12 months with lab a few days before  -Abdomen USKorean 6 months to monitor her liver     Orders Placed This Encounter  Procedures  . USKoreabdomen Complete    Standing Status:  Future    Standing Expiration Date:   12/16/2019    Order Specific Question:   Reason for Exam (SYMPTOM  OR DIAGNOSIS REQUIRED)    Answer:   monitor liver    Order Specific Question:   Preferred imaging location?    Answer:   [facility]    All questions were answered. The patient knows to call the clinic with any problems, questions or concerns. I spent 30 minutes counseling the patient face to face. The total time spent in the appointment was 40 minutes and more than 50% was on counseling.     Truitt Merle, MD 12/16/2018 11:04 PM  I, Joslyn Devon, am acting as scribe for Truitt Merle, MD.   I have reviewed the above documentation for accuracy and completeness, and I agree with the above.

## 2018-12-16 ENCOUNTER — Inpatient Hospital Stay: Payer: Medicare Other

## 2018-12-16 ENCOUNTER — Encounter: Payer: Self-pay | Admitting: Hematology

## 2018-12-16 ENCOUNTER — Inpatient Hospital Stay: Payer: Medicare Other | Attending: Hematology | Admitting: Hematology

## 2018-12-16 ENCOUNTER — Other Ambulatory Visit: Payer: Self-pay

## 2018-12-16 ENCOUNTER — Telehealth: Payer: Self-pay | Admitting: Hematology

## 2018-12-16 DIAGNOSIS — I10 Essential (primary) hypertension: Secondary | ICD-10-CM | POA: Diagnosis not present

## 2018-12-16 DIAGNOSIS — Z7289 Other problems related to lifestyle: Secondary | ICD-10-CM

## 2018-12-16 DIAGNOSIS — Z808 Family history of malignant neoplasm of other organs or systems: Secondary | ICD-10-CM | POA: Diagnosis not present

## 2018-12-16 DIAGNOSIS — Z803 Family history of malignant neoplasm of breast: Secondary | ICD-10-CM

## 2018-12-16 DIAGNOSIS — Z90710 Acquired absence of both cervix and uterus: Secondary | ICD-10-CM

## 2018-12-16 DIAGNOSIS — Z880 Allergy status to penicillin: Secondary | ICD-10-CM | POA: Diagnosis not present

## 2018-12-16 DIAGNOSIS — R7303 Prediabetes: Secondary | ICD-10-CM

## 2018-12-16 DIAGNOSIS — Z8249 Family history of ischemic heart disease and other diseases of the circulatory system: Secondary | ICD-10-CM

## 2018-12-16 LAB — CBC WITH DIFFERENTIAL (CANCER CENTER ONLY)
Abs Immature Granulocytes: 0.01 10*3/uL (ref 0.00–0.07)
Basophils Absolute: 0 10*3/uL (ref 0.0–0.1)
Basophils Relative: 1 %
Eosinophils Absolute: 0.1 10*3/uL (ref 0.0–0.5)
Eosinophils Relative: 2 %
HCT: 42.6 % (ref 36.0–46.0)
Hemoglobin: 14.6 g/dL (ref 12.0–15.0)
Immature Granulocytes: 0 %
Lymphocytes Relative: 35 %
Lymphs Abs: 1.8 10*3/uL (ref 0.7–4.0)
MCH: 34.7 pg — ABNORMAL HIGH (ref 26.0–34.0)
MCHC: 34.3 g/dL (ref 30.0–36.0)
MCV: 101.2 fL — ABNORMAL HIGH (ref 80.0–100.0)
Monocytes Absolute: 0.5 10*3/uL (ref 0.1–1.0)
Monocytes Relative: 10 %
Neutro Abs: 2.8 10*3/uL (ref 1.7–7.7)
Neutrophils Relative %: 52 %
Platelet Count: 235 10*3/uL (ref 150–400)
RBC: 4.21 MIL/uL (ref 3.87–5.11)
RDW: 12.4 % (ref 11.5–15.5)
WBC Count: 5.3 10*3/uL (ref 4.0–10.5)
nRBC: 0 % (ref 0.0–0.2)

## 2018-12-16 LAB — CMP (CANCER CENTER ONLY)
ALT: 71 U/L — ABNORMAL HIGH (ref 0–44)
AST: 37 U/L (ref 15–41)
Albumin: 4.2 g/dL (ref 3.5–5.0)
Alkaline Phosphatase: 40 U/L (ref 38–126)
Anion gap: 10 (ref 5–15)
BUN: 11 mg/dL (ref 8–23)
CO2: 26 mmol/L (ref 22–32)
Calcium: 9.1 mg/dL (ref 8.9–10.3)
Chloride: 103 mmol/L (ref 98–111)
Creatinine: 0.79 mg/dL (ref 0.44–1.00)
GFR, Est AFR Am: >60 mL/min (ref >60)
GFR, Estimated: >60 mL/min (ref >60)
Glucose, Bld: 136 mg/dL — ABNORMAL HIGH (ref 70–99)
Potassium: 3.3 mmol/L — ABNORMAL LOW (ref 3.5–5.1)
Sodium: 139 mmol/L (ref 135–145)
Total Bilirubin: 0.6 mg/dL (ref 0.3–1.2)
Total Protein: 7.4 g/dL (ref 6.5–8.1)

## 2018-12-16 LAB — IRON AND TIBC
Iron: 167 ug/dL — ABNORMAL HIGH (ref 41–142)
Saturation Ratios: 68 % — ABNORMAL HIGH (ref 21–57)
TIBC: 248 ug/dL (ref 236–444)
UIBC: 80 ug/dL — ABNORMAL LOW (ref 120–384)

## 2018-12-16 LAB — FERRITIN: Ferritin: 121 ng/mL (ref 11–307)

## 2018-12-16 MED ORDER — SODIUM CHLORIDE 0.9 % IV SOLN
Freq: Once | INTRAVENOUS | Status: AC
  Administered 2018-12-16: 15:00:00 via INTRAVENOUS
  Filled 2018-12-16: qty 250

## 2018-12-16 NOTE — Patient Instructions (Signed)

## 2018-12-16 NOTE — Progress Notes (Signed)
Jessica Conley presents today for phlebotomy per MD orders. Phlebotomy procedure started at 1550 with 16G by Elnita Maxwell, RN in left [facility] and ended at 1600. 556 grams removed. Patient observed for 30 minutes after procedure without any incident. IVF given before procedure (see eMAR) Patient tolerated procedure well. Beverage provided, pt declined food. IV needle removed intact.

## 2018-12-16 NOTE — Telephone Encounter (Signed)
Scheduled appt per 5/18 los.  A calendar will be mailed out.

## 2018-12-18 ENCOUNTER — Other Ambulatory Visit: Payer: Self-pay | Admitting: Hematology

## 2018-12-18 ENCOUNTER — Telehealth: Payer: Self-pay

## 2018-12-18 DIAGNOSIS — E876 Hypokalemia: Secondary | ICD-10-CM

## 2018-12-18 MED ORDER — POTASSIUM CHLORIDE CRYS ER 20 MEQ PO TBCR: 10.0000 meq | EXTENDED_RELEASE_TABLET | Freq: Every day | ORAL | 0 refills | Status: DC

## 2018-12-18 NOTE — Telephone Encounter (Signed)
-----   Message from Truitt Merle, MD sent at 12/17/2018 10:47 PM EDT ----- Please let pt know his ferritin and iron study results, she had phlebotomy on Monday. Let her know K was low, probably related to HCTZ, please call in KCL 46mq daily for 30 days, and increase K rich food, thanks   [employer] 12/17/2018

## 2018-12-18 NOTE — Telephone Encounter (Signed)
Spoke with patient regarding lab results, per Dr. Burr Medico, iron studies were WNL, potassium is a little low, Dr. Burr Medico would like her to take KCL 10 meq one tablet daily for 30 days.  Script has been sent into [facility] on file.  Patient verbalized an understanding.

## 2019-01-09 ENCOUNTER — Other Ambulatory Visit (HOSPITAL_COMMUNITY): Payer: Self-pay | Admitting: Gastroenterology

## 2019-01-09 ENCOUNTER — Other Ambulatory Visit: Payer: Self-pay | Admitting: Gastroenterology

## 2019-01-09 DIAGNOSIS — R079 Chest pain, unspecified: Secondary | ICD-10-CM | POA: Diagnosis not present

## 2019-01-09 DIAGNOSIS — R1033 Periumbilical pain: Secondary | ICD-10-CM | POA: Diagnosis not present

## 2019-01-09 DIAGNOSIS — R110 Nausea: Secondary | ICD-10-CM | POA: Diagnosis not present

## 2019-01-09 DIAGNOSIS — R1011 Right upper quadrant pain: Secondary | ICD-10-CM

## 2019-01-09 DIAGNOSIS — K219 Gastro-esophageal reflux disease without esophagitis: Secondary | ICD-10-CM | POA: Diagnosis not present

## 2019-01-14 DIAGNOSIS — Z1231 Encounter for screening mammogram for malignant neoplasm of breast: Secondary | ICD-10-CM | POA: Diagnosis not present

## 2019-01-14 DIAGNOSIS — Z803 Family history of malignant neoplasm of breast: Secondary | ICD-10-CM | POA: Diagnosis not present

## 2019-01-15 ENCOUNTER — Encounter (HOSPITAL_COMMUNITY)
Admission: RE | Admit: 2019-01-15 | Discharge: 2019-01-15 | Disposition: A | Discharge status: Home / Self Care | Payer: Medicare Other | Source: Ambulatory Visit | Attending: Gastroenterology | Admitting: Gastroenterology

## 2019-01-15 ENCOUNTER — Other Ambulatory Visit: Payer: Self-pay

## 2019-01-15 ENCOUNTER — Ambulatory Visit (HOSPITAL_COMMUNITY)
Admission: RE | Admit: 2019-01-15 | Discharge: 2019-01-15 | Disposition: A | Discharge status: Home / Self Care | Payer: Medicare Other | Source: Ambulatory Visit | Attending: Gastroenterology | Admitting: Gastroenterology

## 2019-01-15 DIAGNOSIS — R1011 Right upper quadrant pain: Secondary | ICD-10-CM | POA: Insufficient documentation

## 2019-01-15 DIAGNOSIS — R160 Hepatomegaly, not elsewhere classified: Secondary | ICD-10-CM | POA: Diagnosis not present

## 2019-01-15 MED ORDER — TECHNETIUM TC 99M MEBROFENIN IV KIT
5.0000 | PACK | Freq: Once | INTRAVENOUS | Status: AC | PRN
  Administered 2019-01-15: 5 via INTRAVENOUS

## 2019-01-20 DIAGNOSIS — R7309 Other abnormal glucose: Secondary | ICD-10-CM | POA: Diagnosis not present

## 2019-01-20 DIAGNOSIS — E7800 Pure hypercholesterolemia, unspecified: Secondary | ICD-10-CM | POA: Diagnosis not present

## 2019-01-20 DIAGNOSIS — I10 Essential (primary) hypertension: Secondary | ICD-10-CM | POA: Diagnosis not present

## 2019-01-21 ENCOUNTER — Other Ambulatory Visit: Payer: Self-pay | Admitting: Gastroenterology

## 2019-01-21 DIAGNOSIS — R9389 Abnormal findings on diagnostic imaging of other specified body structures: Secondary | ICD-10-CM

## 2019-01-23 DIAGNOSIS — K219 Gastro-esophageal reflux disease without esophagitis: Secondary | ICD-10-CM | POA: Diagnosis not present

## 2019-01-23 DIAGNOSIS — I10 Essential (primary) hypertension: Secondary | ICD-10-CM | POA: Diagnosis not present

## 2019-01-23 DIAGNOSIS — R7309 Other abnormal glucose: Secondary | ICD-10-CM | POA: Diagnosis not present

## 2019-02-04 ENCOUNTER — Ambulatory Visit
Admission: RE | Admit: 2019-02-04 | Discharge: 2019-02-04 | Disposition: A | Discharge status: Home / Self Care | Payer: Medicare Other | Source: Ambulatory Visit | Attending: Gastroenterology | Admitting: Gastroenterology

## 2019-02-04 DIAGNOSIS — K760 Fatty (change of) liver, not elsewhere classified: Secondary | ICD-10-CM | POA: Diagnosis not present

## 2019-02-04 DIAGNOSIS — K7689 Other specified diseases of liver: Secondary | ICD-10-CM | POA: Diagnosis not present

## 2019-02-04 DIAGNOSIS — R9389 Abnormal findings on diagnostic imaging of other specified body structures: Secondary | ICD-10-CM

## 2019-02-04 MED ORDER — GADOBENATE DIMEGLUMINE 529 MG/ML IV SOLN
13.0000 mL | Freq: Once | INTRAVENOUS | Status: AC | PRN
  Administered 2019-02-04: 13 mL via INTRAVENOUS

## 2019-02-11 ENCOUNTER — Telehealth: Payer: Self-pay | Admitting: *Deleted

## 2019-02-11 NOTE — Telephone Encounter (Signed)
LM for patient on her VM that Dr Burr Medico spoke with radiology. No iron overload in liver, small benign cyst in liver and fatty liver, no other concerns.

## 2019-02-13 ENCOUNTER — Other Ambulatory Visit: Payer: Medicare Other

## 2019-02-25 DIAGNOSIS — F419 Anxiety disorder, unspecified: Secondary | ICD-10-CM | POA: Diagnosis not present

## 2019-02-25 DIAGNOSIS — I10 Essential (primary) hypertension: Secondary | ICD-10-CM | POA: Diagnosis not present

## 2019-02-25 DIAGNOSIS — R0789 Other chest pain: Secondary | ICD-10-CM | POA: Diagnosis not present

## 2019-03-17 ENCOUNTER — Other Ambulatory Visit: Payer: Self-pay | Admitting: Internal Medicine

## 2019-03-17 DIAGNOSIS — R9389 Abnormal findings on diagnostic imaging of other specified body structures: Secondary | ICD-10-CM

## 2019-03-24 ENCOUNTER — Ambulatory Visit
Admission: RE | Admit: 2019-03-24 | Discharge: 2019-03-24 | Disposition: A | Discharge status: Home / Self Care | Payer: Medicare Other | Source: Ambulatory Visit | Attending: Internal Medicine | Admitting: Internal Medicine

## 2019-03-24 ENCOUNTER — Other Ambulatory Visit: Payer: Self-pay

## 2019-03-24 DIAGNOSIS — R918 Other nonspecific abnormal finding of lung field: Secondary | ICD-10-CM | POA: Diagnosis not present

## 2019-03-24 DIAGNOSIS — R9389 Abnormal findings on diagnostic imaging of other specified body structures: Secondary | ICD-10-CM

## 2019-03-24 MED ORDER — IOPAMIDOL (ISOVUE-300) INJECTION 61%
75.0000 mL | Freq: Once | INTRAVENOUS | Status: AC | PRN
  Administered 2019-03-24: 13:00:00 75 mL via INTRAVENOUS

## 2019-03-28 DIAGNOSIS — I10 Essential (primary) hypertension: Secondary | ICD-10-CM | POA: Diagnosis not present

## 2019-03-28 DIAGNOSIS — Z23 Encounter for immunization: Secondary | ICD-10-CM | POA: Diagnosis not present

## 2019-06-18 ENCOUNTER — Inpatient Hospital Stay: Payer: Medicare Other | Attending: Hematology

## 2019-06-18 ENCOUNTER — Other Ambulatory Visit: Payer: Self-pay

## 2019-06-18 LAB — CBC WITH DIFFERENTIAL (CANCER CENTER ONLY)
Abs Immature Granulocytes: 0 10*3/uL (ref 0.00–0.07)
Basophils Absolute: 0.1 10*3/uL (ref 0.0–0.1)
Basophils Relative: 1 %
Eosinophils Absolute: 0.1 10*3/uL (ref 0.0–0.5)
Eosinophils Relative: 2 %
HCT: 41.6 % (ref 36.0–46.0)
Hemoglobin: 14.2 g/dL (ref 12.0–15.0)
Immature Granulocytes: 0 %
Lymphocytes Relative: 34 %
Lymphs Abs: 1.9 10*3/uL (ref 0.7–4.0)
MCH: 34.5 pg — ABNORMAL HIGH (ref 26.0–34.0)
MCHC: 34.1 g/dL (ref 30.0–36.0)
MCV: 101.2 fL — ABNORMAL HIGH (ref 80.0–100.0)
Monocytes Absolute: 0.7 10*3/uL (ref 0.1–1.0)
Monocytes Relative: 12 %
Neutro Abs: 3 10*3/uL (ref 1.7–7.7)
Neutrophils Relative %: 51 %
Platelet Count: 231 10*3/uL (ref 150–400)
RBC: 4.11 MIL/uL (ref 3.87–5.11)
RDW: 12.7 % (ref 11.5–15.5)
WBC Count: 5.7 10*3/uL (ref 4.0–10.5)
nRBC: 0 % (ref 0.0–0.2)

## 2019-06-18 LAB — CMP (CANCER CENTER ONLY)
ALT: 43 U/L (ref 0–44)
AST: 29 U/L (ref 15–41)
Albumin: 4.5 g/dL (ref 3.5–5.0)
Alkaline Phosphatase: 45 U/L (ref 38–126)
Anion gap: 12 (ref 5–15)
BUN: 14 mg/dL (ref 8–23)
CO2: 26 mmol/L (ref 22–32)
Calcium: 9.4 mg/dL (ref 8.9–10.3)
Chloride: 100 mmol/L (ref 98–111)
Creatinine: 0.82 mg/dL (ref 0.44–1.00)
GFR, Est AFR Am: >60 mL/min (ref >60)
GFR, Estimated: >60 mL/min (ref >60)
Glucose, Bld: 96 mg/dL (ref 70–99)
Potassium: 4 mmol/L (ref 3.5–5.1)
Sodium: 138 mmol/L (ref 135–145)
Total Bilirubin: 0.8 mg/dL (ref 0.3–1.2)
Total Protein: 7.8 g/dL (ref 6.5–8.1)

## 2019-06-18 LAB — IRON AND TIBC
Iron: 155 ug/dL — ABNORMAL HIGH (ref 41–142)
Saturation Ratios: 58 % — ABNORMAL HIGH (ref 21–57)
TIBC: 265 ug/dL (ref 236–444)
UIBC: 110 ug/dL — ABNORMAL LOW (ref 120–384)

## 2019-06-18 LAB — FERRITIN: Ferritin: 59 ng/mL (ref 11–307)

## 2019-06-25 ENCOUNTER — Telehealth: Payer: Self-pay

## 2019-06-25 NOTE — Telephone Encounter (Signed)
-----   Message from Truitt Merle, MD sent at 06/25/2019  3:15 PM EST ----- Please let pt know that her iron level is acceptable last week, no need phlebotomy this time, thanks   Truitt Merle  06/25/2019

## 2019-06-25 NOTE — Telephone Encounter (Signed)
Per Dr. Burr Medico left this patient a voice message regarding her lab results.  Iron level was acceptable last week, no need for phlebotomy at this time. Encouraged her to call back if she has any questions.

## 2019-08-09 ENCOUNTER — Ambulatory Visit: Payer: Medicare Other

## 2019-08-09 ENCOUNTER — Ambulatory Visit: Payer: Medicare Other | Attending: Internal Medicine

## 2019-08-09 DIAGNOSIS — Z23 Encounter for immunization: Secondary | ICD-10-CM

## 2019-08-09 NOTE — Progress Notes (Signed)
   Covid-19 Vaccination Clinic  Name:  TYLASIA FLETCHALL    MRN: [id number] DOB: 1945-12-19  08/09/2019  Ms. Gerstenberger was observed post Covid-19 immunization for 30 minutes based on pre-vaccination screening without incidence. She was provided with Vaccine Information Sheet and instruction to access the V-Safe system.   Ms. Pita was instructed to call 911 with any severe reactions post vaccine: Marland Kitchen Difficulty breathing  . Swelling of your face and throat  . A fast heartbeat  . A bad rash all over your body  . Dizziness and weakness    Immunizations Administered    Name Date Dose VIS Date Route   Pfizer COVID-19 Vaccine 08/09/2019 12:40 PM 0.3 mL 07/11/2019 Intramuscular   Manufacturer: [place]   Lot: [id number]   [place]: [id number]

## 2019-08-27 ENCOUNTER — Ambulatory Visit: Payer: Medicare Other

## 2019-08-30 ENCOUNTER — Ambulatory Visit: Payer: Medicare Other | Attending: Internal Medicine

## 2019-08-30 DIAGNOSIS — Z23 Encounter for immunization: Secondary | ICD-10-CM | POA: Insufficient documentation

## 2019-08-30 NOTE — Progress Notes (Signed)
   Covid-19 Vaccination Clinic  Name:  [name]    MRN: [id number] DOB: [birth year]  08/30/2019  [name] was observed post Covid-19 immunization for 30 minutes based on pre-vaccination screening without incidence. She was provided with Vaccine Information Sheet and instruction to access the V-Safe system.   [name] was instructed to call 911 with any severe reactions post vaccine: Marland Kitchen Difficulty breathing  . Swelling of your face and throat  . A fast heartbeat  . A bad rash all over your body  . Dizziness and weakness    Immunizations Administered    Name Date Dose VIS Date Route   Pfizer COVID-19 Vaccine 08/30/2019  8:08 AM 0.3 mL 07/11/2019 Intramuscular   Manufacturer: [place]   Lot: [id number]   [place]: [id number]

## 2019-09-17 DIAGNOSIS — R7309 Other abnormal glucose: Secondary | ICD-10-CM | POA: Diagnosis not present

## 2019-09-17 DIAGNOSIS — E7800 Pure hypercholesterolemia, unspecified: Secondary | ICD-10-CM | POA: Diagnosis not present

## 2019-09-17 DIAGNOSIS — I10 Essential (primary) hypertension: Secondary | ICD-10-CM | POA: Diagnosis not present

## 2019-09-23 DIAGNOSIS — K900 Celiac disease: Secondary | ICD-10-CM | POA: Diagnosis not present

## 2019-09-23 DIAGNOSIS — E7800 Pure hypercholesterolemia, unspecified: Secondary | ICD-10-CM | POA: Diagnosis not present

## 2019-09-23 DIAGNOSIS — Z0000 Encounter for general adult medical examination without abnormal findings: Secondary | ICD-10-CM | POA: Diagnosis not present

## 2019-09-23 DIAGNOSIS — I10 Essential (primary) hypertension: Secondary | ICD-10-CM | POA: Diagnosis not present

## 2019-12-12 ENCOUNTER — Other Ambulatory Visit: Payer: Self-pay

## 2019-12-15 ENCOUNTER — Other Ambulatory Visit: Payer: Self-pay

## 2019-12-15 ENCOUNTER — Inpatient Hospital Stay: Payer: Medicare Other | Attending: Hematology

## 2019-12-15 DIAGNOSIS — Z880 Allergy status to penicillin: Secondary | ICD-10-CM | POA: Diagnosis not present

## 2019-12-15 DIAGNOSIS — R7303 Prediabetes: Secondary | ICD-10-CM | POA: Insufficient documentation

## 2019-12-15 DIAGNOSIS — Z79899 Other long term (current) drug therapy: Secondary | ICD-10-CM | POA: Insufficient documentation

## 2019-12-15 DIAGNOSIS — I10 Essential (primary) hypertension: Secondary | ICD-10-CM | POA: Diagnosis not present

## 2019-12-15 DIAGNOSIS — R55 Syncope and collapse: Secondary | ICD-10-CM | POA: Diagnosis not present

## 2019-12-15 LAB — CMP (CANCER CENTER ONLY)
ALT: 43 U/L (ref 0–44)
AST: 29 U/L (ref 15–41)
Albumin: 4.2 g/dL (ref 3.5–5.0)
Alkaline Phosphatase: 43 U/L (ref 38–126)
Anion gap: 10 (ref 5–15)
BUN: 15 mg/dL (ref 8–23)
CO2: 27 mmol/L (ref 22–32)
Calcium: 9.1 mg/dL (ref 8.9–10.3)
Chloride: 103 mmol/L (ref 98–111)
Creatinine: 0.79 mg/dL (ref 0.44–1.00)
GFR, Est AFR Am: >60 mL/min (ref >60)
GFR, Estimated: >60 mL/min (ref >60)
Glucose, Bld: 105 mg/dL — ABNORMAL HIGH (ref 70–99)
Potassium: 3.9 mmol/L (ref 3.5–5.1)
Sodium: 140 mmol/L (ref 135–145)
Total Bilirubin: 0.5 mg/dL (ref 0.3–1.2)
Total Protein: 7.4 g/dL (ref 6.5–8.1)

## 2019-12-15 LAB — FERRITIN: Ferritin: 74 ng/mL (ref 11–307)

## 2019-12-15 LAB — CBC WITH DIFFERENTIAL (CANCER CENTER ONLY)
Abs Immature Granulocytes: 0.01 10*3/uL (ref 0.00–0.07)
Basophils Absolute: 0 10*3/uL (ref 0.0–0.1)
Basophils Relative: 1 %
Eosinophils Absolute: 0.1 10*3/uL (ref 0.0–0.5)
Eosinophils Relative: 2 %
HCT: 41.6 % (ref 36.0–46.0)
Hemoglobin: 14.1 g/dL (ref 12.0–15.0)
Immature Granulocytes: 0 %
Lymphocytes Relative: 38 %
Lymphs Abs: 2.2 10*3/uL (ref 0.7–4.0)
MCH: 34.6 pg — ABNORMAL HIGH (ref 26.0–34.0)
MCHC: 33.9 g/dL (ref 30.0–36.0)
MCV: 102.2 fL — ABNORMAL HIGH (ref 80.0–100.0)
Monocytes Absolute: 0.9 10*3/uL (ref 0.1–1.0)
Monocytes Relative: 14 %
Neutro Abs: 2.7 10*3/uL (ref 1.7–7.7)
Neutrophils Relative %: 45 %
Platelet Count: 245 10*3/uL (ref 150–400)
RBC: 4.07 MIL/uL (ref 3.87–5.11)
RDW: 12.1 % (ref 11.5–15.5)
WBC Count: 6 10*3/uL (ref 4.0–10.5)
nRBC: 0 % (ref 0.0–0.2)

## 2019-12-15 LAB — IRON AND TIBC
Iron: 134 ug/dL (ref 41–142)
Saturation Ratios: 50 % (ref 21–57)
TIBC: 269 ug/dL (ref 236–444)
UIBC: 135 ug/dL (ref 120–384)

## 2019-12-15 NOTE — Progress Notes (Signed)
[place]   Telephone:(336) [phone] Fax:(336) [phone]   Clinic Follow up Note   Patient Care Team: Jani Gravel, MD as PCP - General (Internal Medicine)  Date of Service:  12/17/2019  CHIEF COMPLAINT: F/u of Hereditary Hemochromatosis   CURRENT THERAPY:  Partial Phlebotomy (352m blood removed with NS 5037minfusion) for Ferritin>100.     INTERVAL HISTORY:  Jessica Conley here for a follow up of Hereditary Hemochromatosis. She was last seen by me 1 year ago. She presents to the clinic alone. She notes she is doing well. I reviewed her medication list with her. She notes her BG is borderline high lately and her BP elevated in clinic today. She sees her PCP every 6 months with labs. She notes she has been walking more to reduce her BP, but is not enough.     REVIEW OF SYSTEMS:   Constitutional: Denies fevers, chills or abnormal weight loss Eyes: Denies blurriness of vision Ears, nose, mouth, throat, and face: Denies mucositis or sore throat Respiratory: Denies cough, dyspnea or wheezes Cardiovascular: Denies palpitation, chest discomfort or lower extremity swelling Gastrointestinal:  Denies nausea, heartburn or change in bowel habits Skin: Denies abnormal skin rashes Lymphatics: Denies new lymphadenopathy or easy bruising Neurological:Denies numbness, tingling or new weaknesses Behavioral/Psych: Mood is stable, no new changes  All other systems were reviewed with the patient and are negative.  MEDICAL HISTORY:  Past Medical History:  Diagnosis Date  . Celiac sprue   . [place]  . Hypertension     SURGICAL HISTORY: Past Surgical History:  Procedure Laterality Date  . EYE SURGERY    . PHLEBOTOMY THERAPEUTIC  08/21/2011      . VAGINAL HYSTERECTOMY  early age [age]s prolapse    I have reviewed the social history and family history with the patient and they are unchanged from previous note.  ALLERGIES:  is  allergic to wheat and penicillins.  MEDICATIONS:  Current Outpatient Medications  Medication Sig Dispense Refill  . amLODipine (NORVASC) 2.5 MG tablet Take 2.5 mg by mouth daily.    . Marland KitchenOLIC ACID PO Take 1 tablet by mouth daily.    . Marland Kitchenosartan-hydrochlorothiazide (HYZAAR) 100-25 MG per tablet Take 1 tablet by mouth daily.    . metFORMIN (GLUCOPHAGE) 500 MG tablet Take 500 mg by mouth daily.    . Multiple Vitamin (MULITIVITAMIN WITH MINERALS) TABS Take 1 tablet by mouth daily.    . Marland Kitchenmeprazole (PRILOSEC) 40 MG capsule Take 40 mg by mouth daily.    . potassium chloride SA (K-DUR) 20 MEQ tablet TAKE 1/2 TABLET(10 MEQ) BY MOUTH DAILY 45 tablet 1  . pravastatin (PRAVACHOL) 20 MG tablet Take 40 mg by mouth daily.      No current facility-administered medications for this visit.    PHYSICAL EXAMINATION: ECOG PERFORMANCE STATUS: 0 - Asymptomatic  Vitals:   12/17/19 1005  BP: (!) 153/85  Pulse: 80  Resp: 18  Temp: 98.4 F (36.9 C)  SpO2: 98%   There were no vitals filed for this visit.  Due to COMontrose Manore will limit examination to appearance. Patient had no complaints.  GENERAL:alert, no distress and comfortable SKIN: skin color normal, no rashes or significant lesions EYES: normal, Conjunctiva are pink and non-injected, sclera clear  NEURO: alert & oriented x 3 with fluent speech   LABORATORY DATA:  I have reviewed the data as listed CBC Latest Ref Rng & Units 12/15/2019 06/18/2019 12/16/2018  WBC 4.0 -  10.5 K/uL 6.0 5.7 5.3  Hemoglobin 12.0 - 15.0 g/dL 14.1 14.2 14.6  Hematocrit 36.0 - 46.0 % 41.6 41.6 42.6  Platelets 150 - 400 K/uL 245 231 235     CMP Latest Ref Rng & Units 12/15/2019 06/18/2019 12/16/2018  Glucose 70 - 99 mg/dL 105(H) 96 136(H)  BUN 8 - 23 mg/dL 15 14 11   Creatinine 0.44 - 1.00 mg/dL 0.79 0.82 0.79  Sodium 135 - 145 mmol/L 140 138 139  Potassium 3.5 - 5.1 mmol/L 3.9 4.0 3.3(L)  Chloride 98 - 111 mmol/L 103 100 103  CO2 22 - 32 mmol/L 27 26 26   Calcium 8.9 -  10.3 mg/dL 9.1 9.4 9.1  Total Protein 6.5 - 8.1 g/dL 7.4 7.8 7.4  Total Bilirubin 0.3 - 1.2 mg/dL 0.5 0.8 0.6  Alkaline Phos 38 - 126 U/L 43 45 40  AST 15 - 41 U/L 29 29 37  ALT 0 - 44 U/L 43 43 71(H)      RADIOGRAPHIC STUDIES: I have personally reviewed the radiological images as listed and agreed with the findings in the report. No results found.   ASSESSMENT & PLAN:  Jessica Conley is a 74 y.o. female with   1. Hereditary hemochromatosis, homozygote C282Y -Diagnosed in 09/2010. Peak ferritin 461 at time of diagnosis. -She had a liver biopsy on 09/09/2010. 4+ iron. Mild periportal inflammation consistent with underlying steatosis.  -Dr Riki Sheer restarted her on phlebotomies. She had poor tolerance and had a brief syncopal episode. She stabilized quickly with saline administration.Dr. Darnell Level recommended to keep her ferritin below 150 -Will continue phlebotomy treatment with goal of Ferritin below 100 due to her age and poor tolerance (typical goal is 67). Will give IV Fluids with each phlebotomy and I strongly encouraged she eat and drink plenty of fluids beforehand. -Will check her liver with [place] every 1-2 years to monitor for iron overload which can lead to liver cirrhosis and other complications.  -I reviewed her family history. She had 3 children, 1 deceased and the other 2 were tested negative for hemochromatosis. I discussed she should encourage her children to monitor their iron level.  -She is clinically doing well. Labs reviewed, Hg and iron normal, MCV 102.2, Ferritin 74. No need phlebotomy today.  -Will proceed with US abdomen in 2-3 weeks.  -Lab every 6 months with Dr Maudie Mercury. F/u in 1 year.    2. HTN, Borderline DM  -On Hyzaar, Metformin, oral potassium  -She notes she has been trying to walk more, but is not enough to reduce her BP. I discussed reducing salt in diet and likely may need more medication. She will f/u with her PCP.   3. Cancer Screening  -I encouraged  her to continue age appropriate cancer screenings  -She will continue to yearly mammograms -By her next coloscopy she will be 74, GI did not recommend she continue screening.    PLAN:  -She will have labs every 6 months with Dr. Maudie Mercury and send our clinic the reports.  -Proceed with Phlebotomy if Ferritin>100 with 35m blood removed, and NS 503minfusion  -Abdomen USKoreao monitor her liver in 2-3 weeks  -Lab and F/u in 1 year    No problem-specific Assessment & Plan notes found for this encounter.   Orders Placed This Encounter  Procedures  . USKoreabdomen Complete    Standing Status:   Future    Standing Expiration Date:   12/16/2020    Order Specific Question:   Reason  for Exam (SYMPTOM  OR DIAGNOSIS REQUIRED)    Answer:   hemochromatosis    Order Specific Question:   Preferred imaging location?    Answer:   [facility]   All questions were answered. The patient knows to call the clinic with any problems, questions or concerns. No barriers to learning was detected. The total time spent in the appointment was 20 minutes.     Truitt Merle, MD 12/17/2019   I, Joslyn Devon, am acting as scribe for Truitt Merle, MD.   I have reviewed the above documentation for accuracy and completeness, and I agree with the above.

## 2019-12-16 ENCOUNTER — Encounter: Payer: Self-pay | Admitting: Hematology

## 2019-12-17 ENCOUNTER — Other Ambulatory Visit: Payer: Self-pay

## 2019-12-17 ENCOUNTER — Inpatient Hospital Stay: Payer: Medicare Other

## 2019-12-17 ENCOUNTER — Inpatient Hospital Stay: Payer: Medicare Other | Admitting: Hematology

## 2019-12-17 ENCOUNTER — Encounter: Payer: Self-pay | Admitting: Hematology

## 2019-12-17 DIAGNOSIS — I10 Essential (primary) hypertension: Secondary | ICD-10-CM | POA: Diagnosis not present

## 2019-12-17 DIAGNOSIS — R7303 Prediabetes: Secondary | ICD-10-CM | POA: Diagnosis not present

## 2019-12-17 DIAGNOSIS — Z880 Allergy status to penicillin: Secondary | ICD-10-CM | POA: Diagnosis not present

## 2019-12-17 DIAGNOSIS — R55 Syncope and collapse: Secondary | ICD-10-CM | POA: Diagnosis not present

## 2019-12-17 DIAGNOSIS — Z79899 Other long term (current) drug therapy: Secondary | ICD-10-CM | POA: Diagnosis not present

## 2019-12-24 ENCOUNTER — Other Ambulatory Visit: Payer: Self-pay

## 2019-12-24 ENCOUNTER — Ambulatory Visit (HOSPITAL_COMMUNITY)
Admission: RE | Admit: 2019-12-24 | Discharge: 2019-12-24 | Disposition: A | Discharge status: Home / Self Care | Payer: Medicare Other | Source: Ambulatory Visit | Attending: Hematology | Admitting: Hematology

## 2019-12-24 DIAGNOSIS — K760 Fatty (change of) liver, not elsewhere classified: Secondary | ICD-10-CM | POA: Diagnosis not present

## 2020-01-06 ENCOUNTER — Telehealth: Payer: Self-pay

## 2020-01-06 NOTE — Telephone Encounter (Signed)
I spoke with Ms Diggs and relayed Dr. Ernestina Penna comments regarding her [place] results.  She verbalized understanding.

## 2020-01-06 NOTE — Telephone Encounter (Signed)
-----   Message from Truitt Merle, MD sent at 12/30/2019  6:58 PM EDT ----- Please let pt know her [place] result, and encourage her to f/u with PCP for her fatty liver, no other new concerns, thanks  Truitt Merle

## 2020-01-30 DIAGNOSIS — H6122 Impacted cerumen, left ear: Secondary | ICD-10-CM | POA: Diagnosis not present

## 2020-02-04 ENCOUNTER — Encounter (HOSPITAL_COMMUNITY): Payer: Self-pay

## 2020-02-04 ENCOUNTER — Other Ambulatory Visit: Payer: Self-pay

## 2020-02-04 ENCOUNTER — Ambulatory Visit (HOSPITAL_COMMUNITY)
Admission: RE | Admit: 2020-02-04 | Discharge: 2020-02-04 | Disposition: A | Discharge status: Home / Self Care | Payer: Medicare Other | Source: Ambulatory Visit | Attending: Urgent Care | Admitting: Urgent Care

## 2020-02-04 VITALS — BP 144/94 | HR 76 | Temp 98.2°F | Resp 18

## 2020-02-04 DIAGNOSIS — R9431 Abnormal electrocardiogram [ECG] [EKG]: Secondary | ICD-10-CM

## 2020-02-04 DIAGNOSIS — R0789 Other chest pain: Secondary | ICD-10-CM | POA: Diagnosis not present

## 2020-02-04 DIAGNOSIS — R1010 Upper abdominal pain, unspecified: Secondary | ICD-10-CM

## 2020-02-04 NOTE — ED Triage Notes (Signed)
Pt here for upper abd pain and burning into throat x 3 days with hx of same in past; pt has had testing without conclusion; pt sts taking some omeprazole without relief

## 2020-02-04 NOTE — ED Provider Notes (Signed)
[facility]   MRN: [id number] DOB: 20-Nov-1945  Subjective:   Jessica Conley is a 74 y.o. female presenting for 3-day history of recurrent upper abdominal/lower internal chest pain that is a burning type sensation that radiates along the sides of the sternum upwards along her neck into her head.  Patient states that when the symptoms comes on she starts to feel panicky, feels heart racing and sweating, wheezing.  She has previously had work-up done including an endoscopy, colonoscopy, MRI without any specific findings for symptoms apart from hepatic steatosis, small benign cyst in posterior right hepatic lobe.  Chest CT scan showed age-indeterminate mild superior endplate deformities of the T7 and T12 vertebral bodies but was otherwise unremarkable on 03/24/2019.  She also has a history of celiac disease, hemochromatosis.  Patient is supposed to be on omeprazole but has been using this every other day.  Does not notice any difference in her symptoms.  She does admit history of anxiety.  Has never had a heart condition.  She does have a history of high blood pressure, is taking triple medication including amlodipine, losartan hydrochlorothiazide.  She is not taking a potassium supplement currently.  No current facility-administered medications for this encounter.  Current Outpatient Medications:  .  amLODipine (NORVASC) 2.5 MG tablet, Take 2.5 mg by mouth daily., Disp: , Rfl:  .  FOLIC ACID PO, Take 1 tablet by mouth daily., Disp: , Rfl:  .  losartan-hydrochlorothiazide (HYZAAR) 100-25 MG per tablet, Take 1 tablet by mouth daily., Disp: , Rfl:  .  metFORMIN (GLUCOPHAGE) 500 MG tablet, Take 500 mg by mouth daily., Disp: , Rfl:  .  Multiple Vitamin (MULITIVITAMIN WITH MINERALS) TABS, Take 1 tablet by mouth daily., Disp: , Rfl:  .  omeprazole (PRILOSEC) 40 MG capsule, Take 40 mg by mouth daily., Disp: , Rfl:  .  potassium chloride SA (K-DUR) 20 MEQ tablet, TAKE 1/2 TABLET(10 MEQ) BY  MOUTH DAILY, Disp: 45 tablet, Rfl: 1 .  pravastatin (PRAVACHOL) 20 MG tablet, Take 40 mg by mouth daily. , Disp: , Rfl:    Allergies  Allergen Reactions  . Wheat     ciliac disease  . Penicillins Rash    Waterblisters     Past Medical History:  Diagnosis Date  . Celiac sprue   . Hemochromatosis, hereditary ([place]) 05/18/2010  . Hypertension      Past Surgical History:  Procedure Laterality Date  . EYE SURGERY    . PHLEBOTOMY THERAPEUTIC  08/21/2011      . VAGINAL HYSTERECTOMY  early age 1s   prolapse    Family History  Problem Relation Age of Onset  . Heart attack Mother   . Melanoma Father   . Breast cancer Daughter 103    Social History   Tobacco Use  . Smoking status: Never Smoker  . Smokeless tobacco: Never Used  Substance Use Topics  . Alcohol use: Yes    Alcohol/week: 4.0 standard drinks    Types: 4 Glasses of wine per week    Comment: Wine  . Drug use: No    ROS   Objective:   Vitals: BP (!) 144/94 (BP Location: Right Arm)   Pulse 76   Temp 98.2 F (36.8 C) (Oral)   Resp 18   SpO2 100%   Physical Exam Constitutional:      General: She is not in acute distress.    Appearance: Normal appearance. She is well-developed and normal weight. She is not ill-appearing, toxic-appearing or  diaphoretic.  HENT:     Head: Normocephalic and atraumatic.     Right Ear: External ear normal.     Left Ear: External ear normal.     Nose: Nose normal.     Mouth/Throat:     Mouth: Mucous membranes are moist.     Pharynx: Oropharynx is clear.  Eyes:     General: No scleral icterus.    Extraocular Movements: Extraocular movements intact.     Pupils: Pupils are equal, round, and reactive to light.  Cardiovascular:     Rate and Rhythm: Normal rate and regular rhythm.     Pulses: Normal pulses.     Heart sounds: Normal heart sounds. No murmur heard.  No friction rub. No gallop.   Pulmonary:     Effort: Pulmonary effort is normal. No respiratory distress.      Breath sounds: Normal breath sounds. No stridor. No wheezing, rhonchi or rales.  Abdominal:     General: Bowel sounds are normal. There is no distension.     Palpations: Abdomen is soft. There is no mass.     Tenderness: There is no abdominal tenderness. There is no right CVA tenderness, left CVA tenderness, guarding or rebound.  Skin:    General: Skin is warm and dry.     Coloration: Skin is not pale.     Findings: No rash.  Neurological:     General: No focal deficit present.     Mental Status: She is alert and oriented to person, place, and time.  Psychiatric:        Mood and Affect: Mood normal.        Behavior: Behavior normal.        Thought Content: Thought content normal.        Judgment: Judgment normal.     ED ECG REPORT   Date: 02/04/2020  Rate: 67bpm  Rhythm: normal sinus rhythm  QRS Axis: left  Intervals: normal  ST/T Wave abnormalities: nonspecific T wave changes  Conduction Disutrbances:Left anterior fascicular block  Narrative Interpretation: Sinus rhythm at 67 bpm with left axis deviation, nonspecific T wave flattening in aVL which is unchanged from previous EKG in 2013.  Has also developed probable left anterior fascicular block.  Old EKG Reviewed: changes noted  I have personally reviewed the EKG tracing and agree with the computerized printout as noted.   Assessment and Plan :   PDMP not reviewed this encounter.  1. Atypical chest pain   2. Pain of upper abdomen   3. Nonspecific abnormal electrocardiogram (ECG) (EKG)     Nonspecific abnormal EKG.  Vital signs and physical exam findings otherwise stable for discharge.  Recommended patient use omeprazole daily as prescribed.  Also encouraged patient to follow-up with her PCP, discuss referral to cardiologist for further work-up. Counseled patient on potential for adverse effects with medications prescribed/recommended today, ER and return-to-clinic precautions discussed, patient verbalized  understanding.    Jaynee Eagles, PA-C 02/04/20 1400

## 2020-02-04 NOTE — Discharge Instructions (Signed)
Please start trying to take the omeprazole daily as prescribed.  Your EKG does not show anything alarming but I would like for you discuss a consultation with a cardiologist, referral with your regular doctor.

## 2020-02-10 DIAGNOSIS — R6889 Other general symptoms and signs: Secondary | ICD-10-CM | POA: Diagnosis not present

## 2020-03-05 DIAGNOSIS — R002 Palpitations: Secondary | ICD-10-CM | POA: Diagnosis not present

## 2020-03-08 DIAGNOSIS — R002 Palpitations: Secondary | ICD-10-CM | POA: Diagnosis not present

## 2020-03-09 DIAGNOSIS — Z1231 Encounter for screening mammogram for malignant neoplasm of breast: Secondary | ICD-10-CM | POA: Diagnosis not present

## 2020-03-26 DIAGNOSIS — H9190 Unspecified hearing loss, unspecified ear: Secondary | ICD-10-CM | POA: Insufficient documentation

## 2020-03-26 DIAGNOSIS — H903 Sensorineural hearing loss, bilateral: Secondary | ICD-10-CM | POA: Diagnosis not present

## 2020-03-29 DIAGNOSIS — R7309 Other abnormal glucose: Secondary | ICD-10-CM | POA: Diagnosis not present

## 2020-03-29 DIAGNOSIS — L819 Disorder of pigmentation, unspecified: Secondary | ICD-10-CM | POA: Diagnosis not present

## 2020-03-29 DIAGNOSIS — I10 Essential (primary) hypertension: Secondary | ICD-10-CM | POA: Diagnosis not present

## 2020-03-29 DIAGNOSIS — E7800 Pure hypercholesterolemia, unspecified: Secondary | ICD-10-CM | POA: Diagnosis not present

## 2020-04-23 DIAGNOSIS — Z23 Encounter for immunization: Secondary | ICD-10-CM | POA: Diagnosis not present

## 2020-05-04 ENCOUNTER — Ambulatory Visit: Payer: Medicare Other | Attending: Internal Medicine

## 2020-05-04 ENCOUNTER — Ambulatory Visit: Payer: Medicare Other | Admitting: Podiatry

## 2020-05-04 ENCOUNTER — Ambulatory Visit (INDEPENDENT_AMBULATORY_CARE_PROVIDER_SITE_OTHER): Payer: Medicare Other

## 2020-05-04 ENCOUNTER — Encounter: Payer: Self-pay | Admitting: Podiatry

## 2020-05-04 ENCOUNTER — Other Ambulatory Visit: Payer: Self-pay

## 2020-05-04 DIAGNOSIS — M778 Other enthesopathies, not elsewhere classified: Secondary | ICD-10-CM

## 2020-05-04 DIAGNOSIS — Q828 Other specified congenital malformations of skin: Secondary | ICD-10-CM

## 2020-05-04 DIAGNOSIS — Z23 Encounter for immunization: Secondary | ICD-10-CM

## 2020-05-04 NOTE — Progress Notes (Signed)
   Covid-19 Vaccination Clinic  Name:  Jessica Conley    MRN: [id number] DOB: [birth year]  05/04/2020  Jessica Conley was observed post Covid-19 immunization for 15 minutes without incident. She was provided with Vaccine Information Sheet and instruction to access the V-Safe system.   Jessica Conley was instructed to call 911 with any severe reactions post vaccine: Marland Kitchen Difficulty breathing  . Swelling of face and throat  . A fast heartbeat  . A bad rash all over body  . Dizziness and weakness

## 2020-05-04 NOTE — Progress Notes (Signed)
Subjective:  Patient ID: Jessica Conley, female    DOB: 01-Oct-1945,  MRN: [id number] HPI Chief Complaint  Patient presents with  . Foot Pain    Plantar forefoot right - burning, aching x 3 months, at the time it started she was having burning in her hands and neck too, worse at night, walks for exercise, no treament  . New Patient (Initial Visit)    74 y.o. female presents with the above complaint.   ROS: Denies fever chills nausea vomit muscle aches pains calf pain back pain chest pain shortness of breath.  Past Medical History:  Diagnosis Date  . Celiac sprue   . Hemochromatosis, hereditary ([place]) 05/18/2010  . Hypertension    Past Surgical History:  Procedure Laterality Date  . EYE SURGERY    . PHLEBOTOMY THERAPEUTIC  08/21/2011      . VAGINAL HYSTERECTOMY  early age 9s   prolapse    Current Outpatient Medications:  .  amLODipine (NORVASC) 2.5 MG tablet, Take 2.5 mg by mouth daily., Disp: , Rfl:  .  ergocalciferol (VITAMIN D2) 1.25 MG (50000 UT) capsule, Take by mouth., Disp: , Rfl:  .  FOLIC ACID PO, Take 1 tablet by mouth daily., Disp: , Rfl:  .  losartan-hydrochlorothiazide (HYZAAR) 100-25 MG per tablet, Take 1 tablet by mouth daily., Disp: , Rfl:  .  metFORMIN (GLUCOPHAGE) 500 MG tablet, Take 500 mg by mouth daily., Disp: , Rfl:  .  Multiple Vitamin (MULITIVITAMIN WITH MINERALS) TABS, Take 1 tablet by mouth daily., Disp: , Rfl:  .  omeprazole (PRILOSEC) 40 MG capsule, Take 40 mg by mouth daily., Disp: , Rfl:  .  potassium chloride SA (K-DUR) 20 MEQ tablet, TAKE 1/2 TABLET(10 MEQ) BY MOUTH DAILY, Disp: 45 tablet, Rfl: 1 .  pravastatin (PRAVACHOL) 20 MG tablet, Take 40 mg by mouth daily. , Disp: , Rfl:   Allergies  Allergen Reactions  . Wheat     ciliac disease  . Penicillins Rash    Waterblisters    Review of Systems Objective:  There were no vitals filed for this visit.  General: Well developed, nourished, in no acute distress, alert and oriented x3    Dermatological: Skin is warm, dry and supple bilateral. Nails x 10 are well maintained; remaining integument appears unremarkable at this time. There are no open sores, no preulcerative lesions, no rash or signs of infection present.  Reactive hyperkeratotic lesion plantar aspect of the fifth metatarsals bilaterally.  She also has small verrucoid lesions which are superficial demonstrating thrombosed capillaries and skin lines and circumvent the lesion.  These do not appear to be any type of pigmented lesion.  Vascular: Dorsalis Pedis artery and Posterior Tibial artery pedal pulses are 2/4 bilateral with immedate capillary fill time. Pedal hair growth present. No varicosities and no lower extremity edema present bilateral.   Neruologic: Grossly intact via light touch bilateral. Vibratory intact via tuning fork bilateral. Protective threshold with Semmes Wienstein monofilament intact to all pedal sites bilateral. Patellar and Achilles deep tendon reflexes 2+ bilateral. No Babinski or clonus noted bilateral.   Musculoskeletal: No gross boney pedal deformities bilateral. No pain, crepitus, or limitation noted with foot and ankle range of motion bilateral. Muscular strength 5/5 in all groups tested bilateral.  She has pain on palpation of the second metatarsophalangeal joints and third metatarsophalangeal joint of the right foot.  Gait: Unassisted, Nonantalgic.    Radiographs:  Radiographs taken today demonstrate an osseously mature individual.  No acute findings are  noted.  Assessment & Plan:   Assessment: Capsulitis second metatarsophalangeal joint right foot.  Small verrucoid lesions and porokeratotic lesions plantar aspect bilateral.  Plan: Debrided all reactive hyperkeratotic tissue and discussed appropriate shoe gear stretching exercise ice therapy shoe modifications.  I expressed to her that I feel that her symptoms are minimal at this point and that once they progress we can inject them.   I recommended that she continue to use anti-inflammatories and particularly topical anti-inflammatory such as Voltaren gel.     Jarel Cuadra T. [place], [place]

## 2020-06-18 IMAGING — US ULTRASOUND ABDOMEN LIMITED
1 series · 14 of 25 positions shown · non-contrast
Comparison: July 27, 2015 and November 06, 2016 ultrasounds.

CLINICAL DATA: Right upper quadrant pain.

EXAM:
ULTRASOUND ABDOMEN LIMITED RIGHT UPPER QUADRANT

[Series 1: ultrasound abdomen limited · 14 of 38 slices shown]
[im 1/38]
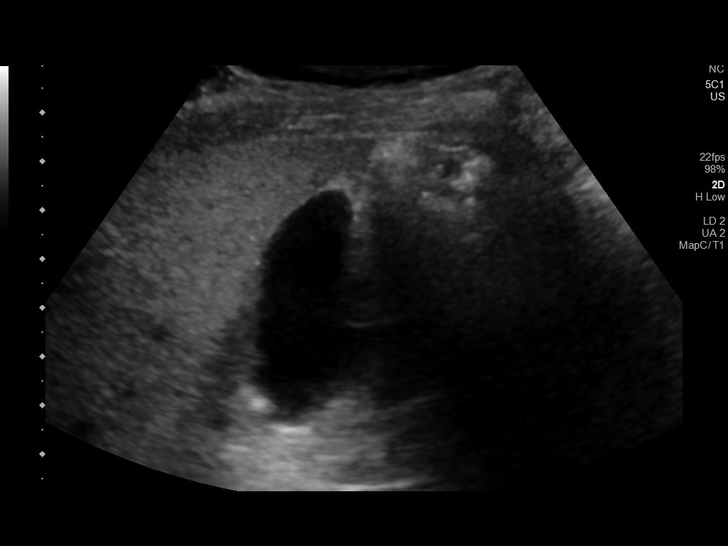
[im 4/38]
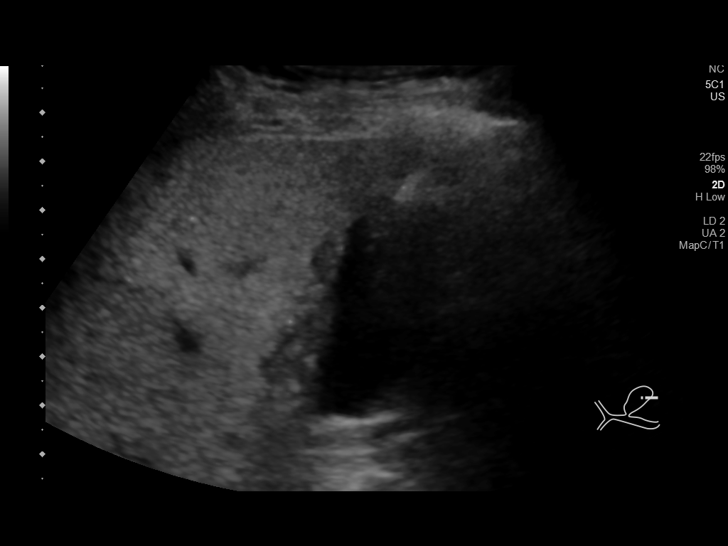
[im 7/38]
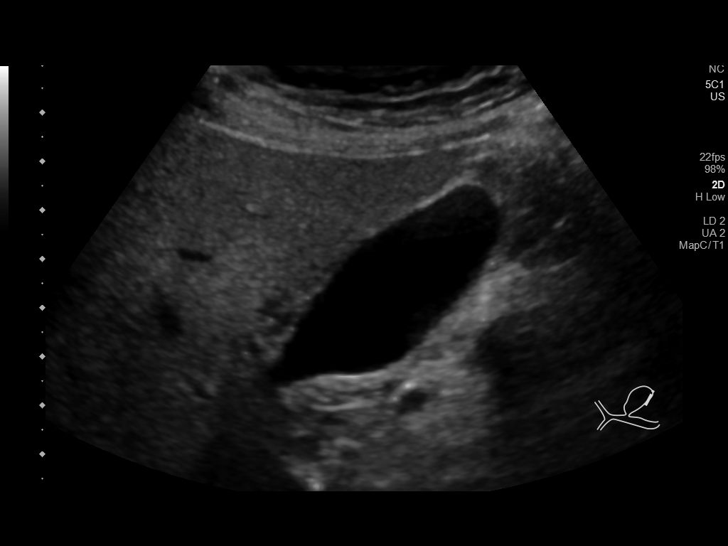
[im 10/38]
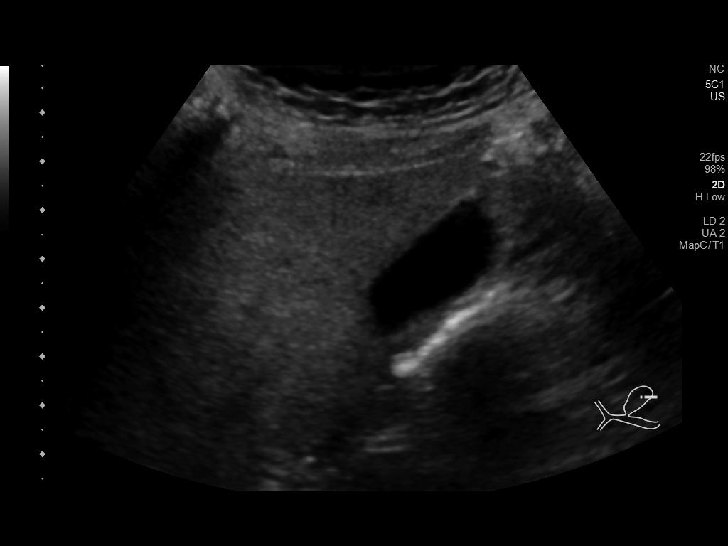
[im 13/38]
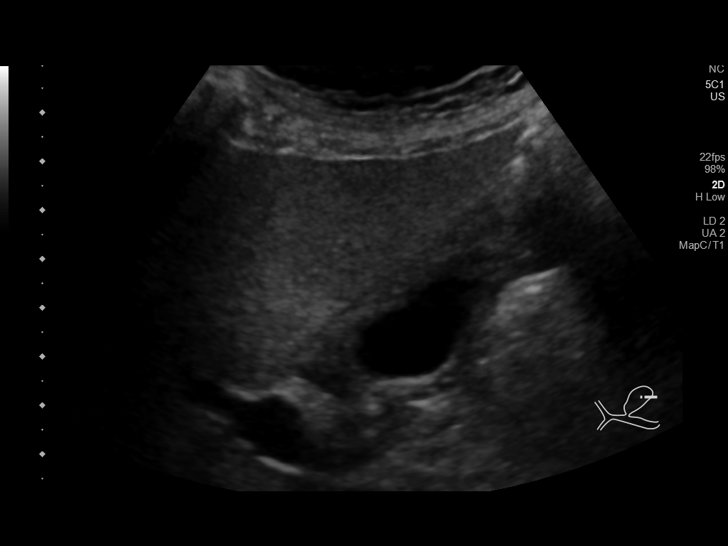
[im 14/38]
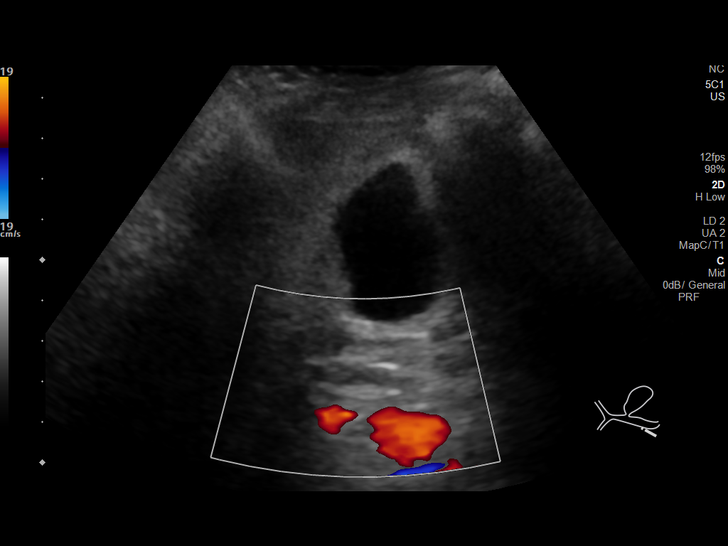
[im 17/38]
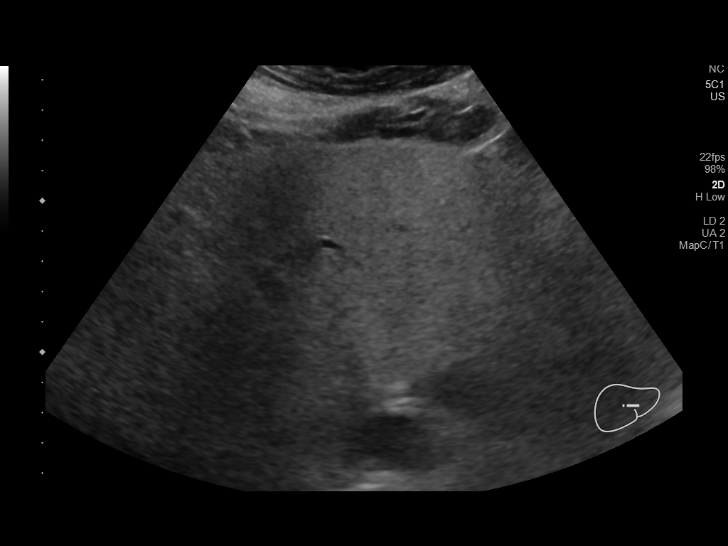
[im 21/38]
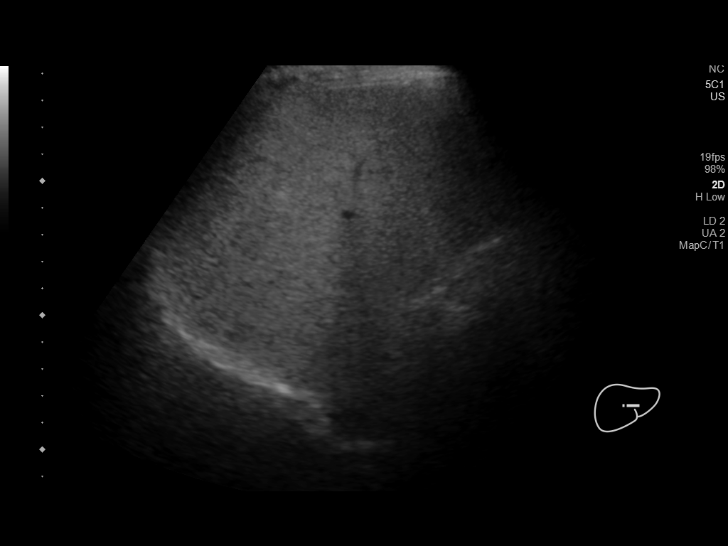
[im 24/38]
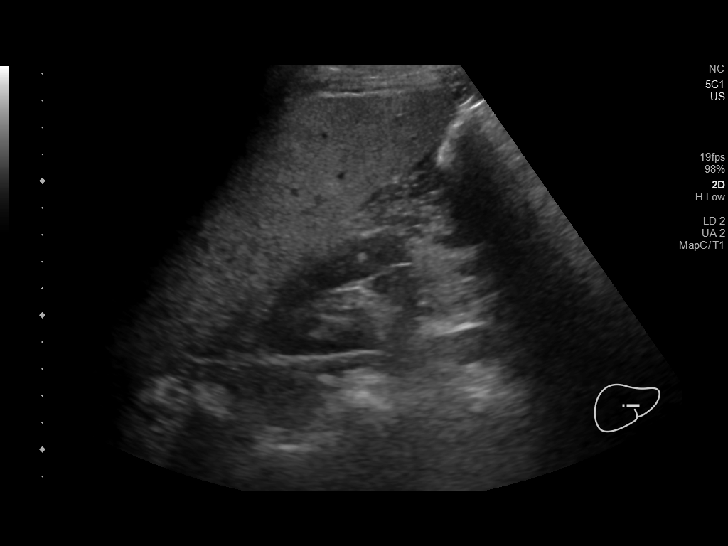
[im 25/38]
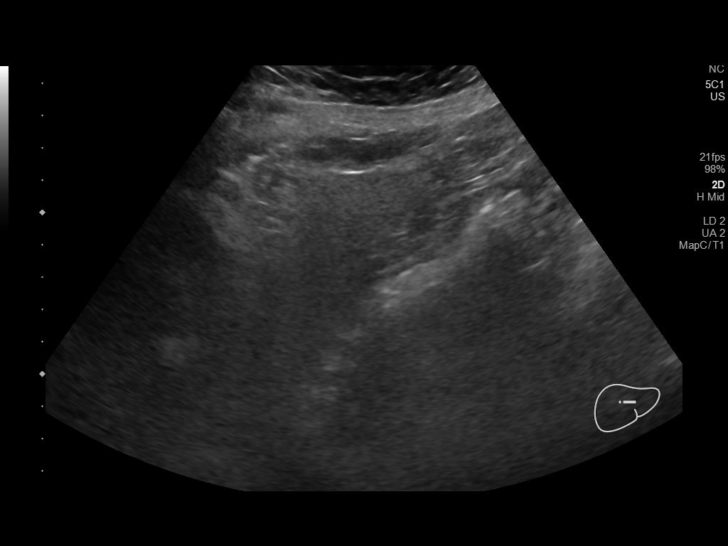
[im 28/38]
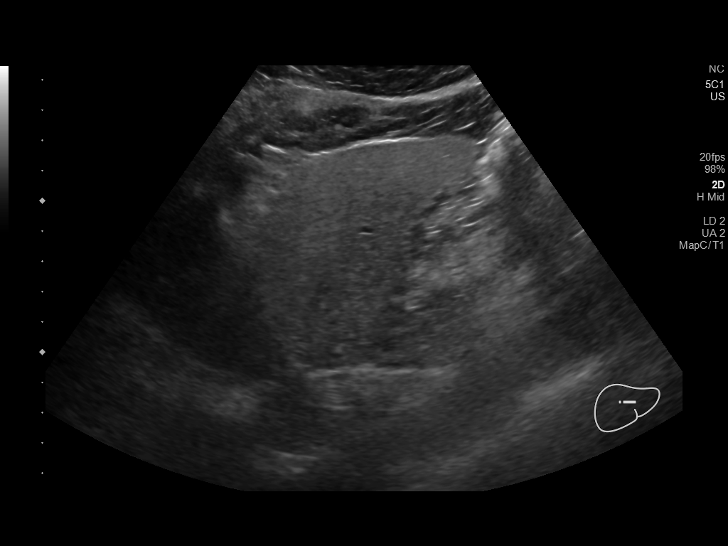
[im 31/38]
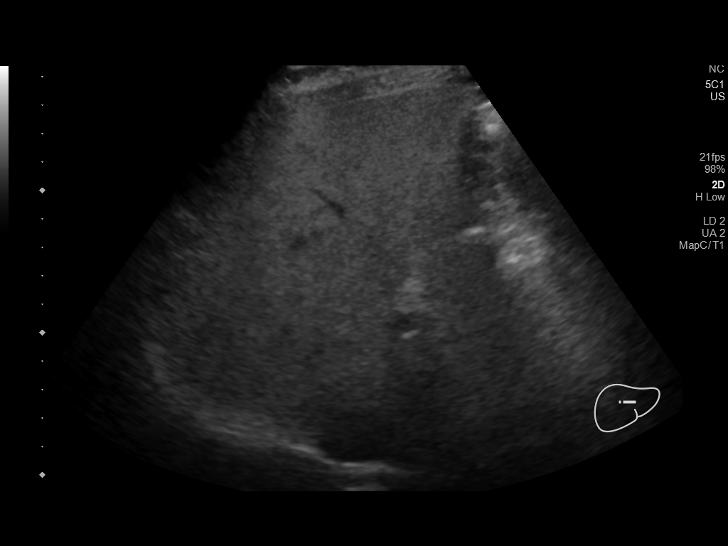
[im 34/38]
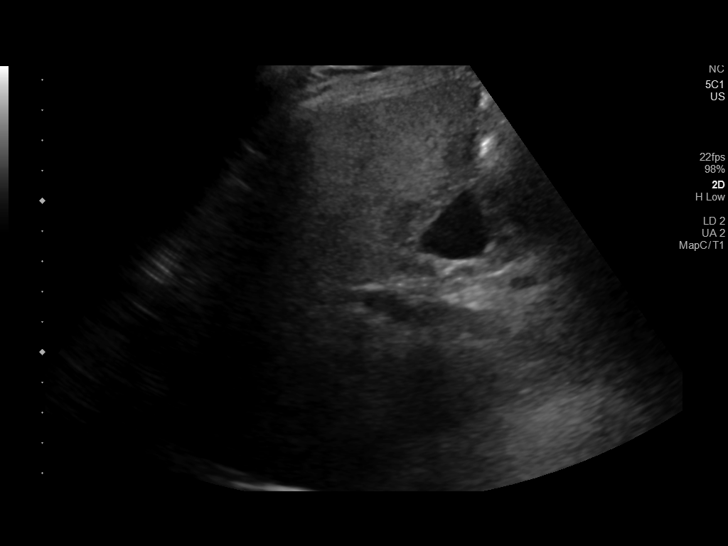
[im 38/38]
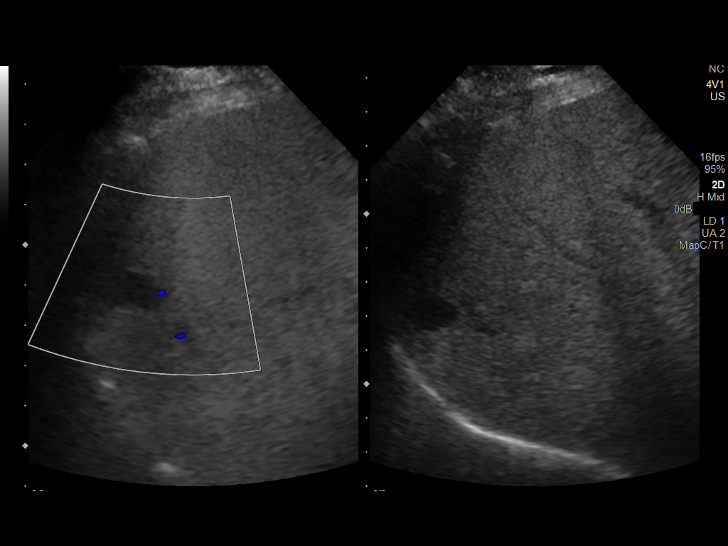

[14 of 25 positions shown; findings below may reference images not displayed]

FINDINGS: Gallbladder:

No gallstones or wall thickening visualized. No sonographic Murphy
sign noted by sonographer.

Common bile duct:

Diameter: 4.7 mm

Liver:

There is a mass in the right hepatic dome measuring 13 x 9 x 16 mm
today versus 9 x 8 x 11 mm in Wednesday July, 2015. Hepatic steatosis.
No other abnormalities. Portal vein is patent on color Doppler
imaging with normal direction of blood flow towards the liver.
IMPRESSION: 1. No acute abnormalities are identified.
2. There is a mass in the hepatic dome which is hypoechoic and too
small to accurately characterize. This mass has grown since Wednesday July, 2015. There is no internal blood flow. There may be minimal
increased through transmission. This mass is favored to represent a
mildly complicated cyst but a slowly growing small solid mass is not
excluded. An MRI could definitively characterize. Alternatively, the
mass could be followed with ultrasound.

## 2020-06-18 IMAGING — NM NUCLEAR MEDICINE HEPATOBILIARY IMAGING WITH GALLBLADDER EF
1 series · 6 of 6 positions shown · non-contrast
Comparison: None.

CLINICAL DATA: Right upper quadrant pain.

EXAM:
NUCLEAR MEDICINE HEPATOBILIARY IMAGING WITH GALLBLADDER EF
TECHNIQUE: Sequential images of the abdomen were obtained [DATE] minutes
following intravenous administration of radiopharmaceutical. After
oral ingestion of Ensure, gallbladder ejection fraction was
determined. At 60 min, normal ejection fraction is greater than 33%.
RADIOPHARMACEUTICALS:  5.0 mCi 3c-WWm  Choletec IV

[Series 1: raw data · 4.46mm/px · 6 of 60 frames shown]
[frame 6/60]
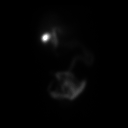
[frame 16/60]
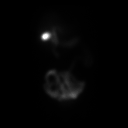
[frame 26/60]
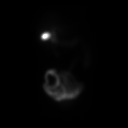
[frame 36/60]
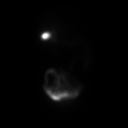
[frame 46/60]
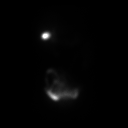
[frame 56/60]
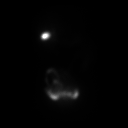

[6 of 6 positions shown; findings below may reference images not displayed]

FINDINGS: Prompt uptake and biliary excretion of activity by the liver is
seen. Gallbladder activity is visualized, consistent with patency of
cystic duct. Biliary activity passes into small bowel, consistent
with patent common bile duct.

Calculated gallbladder ejection fraction is 13%. (Normal gallbladder
ejection fraction with Ensure is greater than 33%.)
IMPRESSION: Abnormally low gallbladder ejection fraction suggesting biliary
dyskinesia.

## 2020-07-01 ENCOUNTER — Other Ambulatory Visit: Payer: Medicare Other

## 2020-09-27 DIAGNOSIS — R7309 Other abnormal glucose: Secondary | ICD-10-CM | POA: Diagnosis not present

## 2020-09-27 DIAGNOSIS — I10 Essential (primary) hypertension: Secondary | ICD-10-CM | POA: Diagnosis not present

## 2020-09-27 DIAGNOSIS — E7800 Pure hypercholesterolemia, unspecified: Secondary | ICD-10-CM | POA: Diagnosis not present

## 2020-10-04 DIAGNOSIS — Z0000 Encounter for general adult medical examination without abnormal findings: Secondary | ICD-10-CM | POA: Diagnosis not present

## 2020-10-04 DIAGNOSIS — I10 Essential (primary) hypertension: Secondary | ICD-10-CM | POA: Diagnosis not present

## 2020-10-04 DIAGNOSIS — K219 Gastro-esophageal reflux disease without esophagitis: Secondary | ICD-10-CM | POA: Diagnosis not present

## 2020-12-06 ENCOUNTER — Telehealth: Payer: Self-pay | Admitting: Hematology

## 2020-12-06 NOTE — Telephone Encounter (Signed)
R/s appt per 5/9 sch msg. Pt aware.

## 2020-12-15 NOTE — Progress Notes (Signed)
[facility]   Telephone:([phone]) [phone] [phone]) [phone]   Clinic Follow up Note   Patient Care Team: Jessica Morning, DO as PCP - General (Family Medicine)  Date of Service:  12/20/2020  CHIEF COMPLAINT: F/u of Hereditary Hemochromatosis  CURRENT THERAPY:  Partial Phlebotomy (356m blood removed with NS 5039minfusion) for Ferritin>100.   INTERVAL HISTORY:  Jessica STAWICKIs here for a follow up of Hereditary Hemochromatosis. She was last seen by me 12/16/20. She presents to the clinic alone. She notes she is doing well. Her BP is elevated today. She notes having a small headaches. She is on 3 HTN medications. I reviewed her medication list with her. She notes her PCP has changed to Jessica CoTheda SersShe notes her identical twin sister is on different treatment. She has not done to test for hemochromatosis. She declines SOB.     REVIEW OF SYSTEMS:   Constitutional: Denies fevers, chills or abnormal weight loss Eyes: Denies blurriness of vision Ears, nose, mouth, throat, and face: Denies mucositis or sore throat Respiratory: Denies cough, dyspnea or wheezes Cardiovascular: Denies palpitation, chest discomfort or lower extremity swelling Gastrointestinal:  Denies nausea, heartburn or change in bowel habits Skin: Denies abnormal skin rashes Lymphatics: Denies new lymphadenopathy or easy bruising Neurological:Denies numbness, tingling or new weaknesses Behavioral/Psych: Mood is stable, no new changes  All other systems were reviewed with the patient and are negative.  MEDICAL HISTORY:  Past Medical History:  Diagnosis Date  . Celiac sprue   . [place]  . Hypertension     SURGICAL HISTORY: Past Surgical History:  Procedure Laterality Date  . EYE SURGERY    . PHLEBOTOMY THERAPEUTIC  08/21/2011      . VAGINAL HYSTERECTOMY  early age [age]s prolapse    I have reviewed the social history and family history with the patient and  they are unchanged from previous note.  ALLERGIES:  is allergic to wheat and penicillins.  MEDICATIONS:  Current Outpatient Medications  Medication Sig Dispense Refill  . amLODipine (NORVASC) 2.5 MG tablet Take 2.5 mg by mouth daily.    . ergocalciferol (VITAMIN D2) 1.25 MG (50000 UT) capsule Take by mouth.    . FOLIC ACID PO Take 1 tablet by mouth daily.    . Marland Kitchenosartan-hydrochlorothiazide (HYZAAR) 100-25 MG per tablet Take 1 tablet by mouth daily.    . metFORMIN (GLUCOPHAGE) 500 MG tablet Take 500 mg by mouth daily.    . Multiple Vitamin (MULITIVITAMIN WITH MINERALS) TABS Take 1 tablet by mouth daily.    . Marland Kitchenmeprazole (PRILOSEC) 40 MG capsule Take 40 mg by mouth daily.    . potassium chloride SA (K-DUR) 20 MEQ tablet TAKE 1/2 TABLET(10 MEQ) BY MOUTH DAILY 45 tablet 1  . pravastatin (PRAVACHOL) 20 MG tablet Take 40 mg by mouth daily.      No current facility-administered medications for this visit.    PHYSICAL EXAMINATION: ECOG PERFORMANCE STATUS: 0 - Asymptomatic  Vitals:   12/20/20 0932  BP: (!) 161/89  Pulse: 76  Resp: 18  Temp: 97.9 F (36.6 C)  SpO2: 99%   Filed Weights   12/20/20 0932  Weight: 150 lb 1.6 oz (68.1 kg)    GENERAL:alert, no distress and comfortable SKIN: skin color, texture, turgor are normal, no rashes or significant lesions EYES: normal, Conjunctiva are pink and non-injected, sclera clear  NECK: supple, thyroid normal size, non-tender, without nodularity LYMPH:  no palpable lymphadenopathy in the cervical, axillary  LUNGS:  clear to auscultation and percussion with normal breathing effort HEART: regular rate & rhythm and no murmurs and no lower extremity edema ABDOMEN:abdomen soft, non-tender and normal bowel sounds Musculoskeletal:no cyanosis of digits and no clubbing  NEURO: alert & oriented x 3 with fluent speech, no focal motor/sensory deficits  LABORATORY DATA:  I have reviewed the data as listed CBC Latest Ref Rng & Units 12/20/2020 12/15/2019  06/18/2019  WBC 4.0 - 10.5 K/uL 5.7 6.0 5.7  Hemoglobin 12.0 - 15.0 g/dL 14.7 14.1 14.2  Hematocrit 36.0 - 46.0 % 42.8 41.6 41.6  Platelets 150 - 400 K/uL 244 245 231     CMP Latest Ref Rng & Units 12/20/2020 12/15/2019 06/18/2019  Glucose 70 - 99 mg/dL 120(H) 105(H) 96  BUN 8 - 23 mg/dL 13 15 14   Creatinine 0.44 - 1.00 mg/dL 0.77 0.79 0.82  Sodium 135 - 145 mmol/L 138 140 138  Potassium 3.5 - 5.1 mmol/L 3.3(L) 3.9 4.0  Chloride 98 - 111 mmol/L 101 103 100  CO2 22 - 32 mmol/L 27 27 26   Calcium 8.9 - 10.3 mg/dL 9.4 9.1 9.4  Total Protein 6.5 - 8.1 g/dL 7.7 7.4 7.8  Total Bilirubin 0.3 - 1.2 mg/dL 0.8 0.5 0.8  Alkaline Phos 38 - 126 U/L 45 43 45  AST 15 - 41 U/L 43(H) 29 29  ALT 0 - 44 U/L 70(H) 43 43      RADIOGRAPHIC STUDIES: I have personally reviewed the radiological images as listed and agreed with the findings in the report. No results found.   ASSESSMENT & PLAN:  Jessica Conley is a 75 y.o. female with    1. Hereditary hemochromatosis, homozygote C282Y -Diagnosed in 09/2010.Peak ferritin 461 at time of diagnosis. She had a liver biopsy on 09/09/2010. 4+ iron. Mild periportal inflammation consistent with underlying steatosis. -Jessica Conley restarted her onphlebotomies since age 39.She had poor tolerance and had a brief syncopal episode.She stabilized quickly with saline administration.Jessica. Darnell Conley recommended to keep her ferritin below 150.  -Will continue phlebotomy treatment with goal of Ferritinbelow 100 due to her age and poor tolerance (typical goal is50). Will give IV Fluids with each phlebotomy and I strongly encouraged she eat and drink plenty of fluids beforehand. -Will check her liver with [place] every 2 years to monitor for iron overload which can lead to liver cirrhosis and other complications.  -She is clinically stable overall. Exam normal. Labs reviewed, Hg and HCT normal. Iron panel still pending.  -Lab every 6 months with Jessica Conley. F/u in 1 year with  next US abdomen.    2.HTN, Borderline DM  -On Hyzaar, Metformin, oral potassium  -Her BP is often elevated in our clinic. She continues HTN medication and f/u with PCP.    3. Cancer Screening  -I encouraged her to continue age appropriate cancer screenings  -She will continue to yearly mammograms -By her next coloscopy she will be 78, GI did not recommend she continue screening.   PLAN: -She will have labs every 6 months with Jessica. Theda Conley and send our clinic the reports.  -Proceed withPhlebotomy if Ferritin>100 with 339m blood removed, and NS 5070minfusion -F/u in 1 year with lab and USKoreabdomen 2 weeks before.    No problem-specific Assessment & Plan notes found for this encounter.   Orders Placed This Encounter  Procedures  . USKoreabdomen Complete    Standing Status:   Future    Standing Expiration Date:   12/20/2021    Order  Specific Question:   Reason for Exam (SYMPTOM  OR DIAGNOSIS REQUIRED)    Answer:   screening liver    Order Specific Question:   Preferred imaging location?    Answer:   [facility]   All questions were answered. The patient knows to call the clinic with any problems, questions or concerns. No barriers to learning was detected. The total time spent in the appointment was 25 minutes.     Truitt Merle, MD 12/20/2020   I, Joslyn Devon, am acting as scribe for Truitt Merle, MD.   I have reviewed the above documentation for accuracy and completeness, and I agree with the above.

## 2020-12-16 ENCOUNTER — Other Ambulatory Visit: Payer: Self-pay

## 2020-12-16 ENCOUNTER — Ambulatory Visit: Payer: Medicare Other | Admitting: Hematology

## 2020-12-16 ENCOUNTER — Other Ambulatory Visit: Payer: Medicare Other

## 2020-12-19 ENCOUNTER — Other Ambulatory Visit: Payer: Self-pay | Admitting: Hematology

## 2020-12-20 ENCOUNTER — Inpatient Hospital Stay: Payer: Medicare Other | Admitting: Hematology

## 2020-12-20 ENCOUNTER — Encounter: Payer: Self-pay | Admitting: Hematology

## 2020-12-20 ENCOUNTER — Telehealth: Payer: Self-pay | Admitting: Hematology

## 2020-12-20 ENCOUNTER — Other Ambulatory Visit: Payer: Self-pay

## 2020-12-20 ENCOUNTER — Inpatient Hospital Stay: Payer: Medicare Other | Attending: Hematology

## 2020-12-20 DIAGNOSIS — R519 Headache, unspecified: Secondary | ICD-10-CM | POA: Insufficient documentation

## 2020-12-20 DIAGNOSIS — R55 Syncope and collapse: Secondary | ICD-10-CM | POA: Diagnosis not present

## 2020-12-20 DIAGNOSIS — Z79899 Other long term (current) drug therapy: Secondary | ICD-10-CM | POA: Insufficient documentation

## 2020-12-20 DIAGNOSIS — Z880 Allergy status to penicillin: Secondary | ICD-10-CM | POA: Insufficient documentation

## 2020-12-20 DIAGNOSIS — I10 Essential (primary) hypertension: Secondary | ICD-10-CM | POA: Insufficient documentation

## 2020-12-20 LAB — CMP (CANCER CENTER ONLY)
ALT: 70 U/L — ABNORMAL HIGH (ref 0–44)
AST: 43 U/L — ABNORMAL HIGH (ref 15–41)
Albumin: 4.3 g/dL (ref 3.5–5.0)
Alkaline Phosphatase: 45 U/L (ref 38–126)
Anion gap: 10 (ref 5–15)
BUN: 13 mg/dL (ref 8–23)
CO2: 27 mmol/L (ref 22–32)
Calcium: 9.4 mg/dL (ref 8.9–10.3)
Chloride: 101 mmol/L (ref 98–111)
Creatinine: 0.77 mg/dL (ref 0.44–1.00)
GFR, Estimated: >60 mL/min (ref >60)
Glucose, Bld: 120 mg/dL — ABNORMAL HIGH (ref 70–99)
Potassium: 3.3 mmol/L — ABNORMAL LOW (ref 3.5–5.1)
Sodium: 138 mmol/L (ref 135–145)
Total Bilirubin: 0.8 mg/dL (ref 0.3–1.2)
Total Protein: 7.7 g/dL (ref 6.5–8.1)

## 2020-12-20 LAB — IRON AND TIBC
Iron: 156 ug/dL — ABNORMAL HIGH (ref 41–142)
Saturation Ratios: 56 % (ref 21–57)
TIBC: 279 ug/dL (ref 236–444)
UIBC: 123 ug/dL (ref 120–384)

## 2020-12-20 LAB — CBC WITH DIFFERENTIAL (CANCER CENTER ONLY)
Abs Immature Granulocytes: 0.01 10*3/uL (ref 0.00–0.07)
Basophils Absolute: 0.1 10*3/uL (ref 0.0–0.1)
Basophils Relative: 1 %
Eosinophils Absolute: 0.1 10*3/uL (ref 0.0–0.5)
Eosinophils Relative: 2 %
HCT: 42.8 % (ref 36.0–46.0)
Hemoglobin: 14.7 g/dL (ref 12.0–15.0)
Immature Granulocytes: 0 %
Lymphocytes Relative: 33 %
Lymphs Abs: 1.9 10*3/uL (ref 0.7–4.0)
MCH: 34.2 pg — ABNORMAL HIGH (ref 26.0–34.0)
MCHC: 34.3 g/dL (ref 30.0–36.0)
MCV: 99.5 fL (ref 80.0–100.0)
Monocytes Absolute: 0.6 10*3/uL (ref 0.1–1.0)
Monocytes Relative: 10 %
Neutro Abs: 3 10*3/uL (ref 1.7–7.7)
Neutrophils Relative %: 54 %
Platelet Count: 244 10*3/uL (ref 150–400)
RBC: 4.3 MIL/uL (ref 3.87–5.11)
RDW: 12 % (ref 11.5–15.5)
WBC Count: 5.7 10*3/uL (ref 4.0–10.5)
nRBC: 0 % (ref 0.0–0.2)

## 2020-12-20 LAB — FERRITIN: Ferritin: 96 ng/mL (ref 11–307)

## 2020-12-20 NOTE — Telephone Encounter (Signed)
Scheduled follow-up appointments per 5/23 los. Patient is aware.

## 2020-12-21 ENCOUNTER — Telehealth: Payer: Self-pay

## 2020-12-21 LAB — AFP TUMOR MARKER: AFP, Serum, Tumor Marker: 1.7 ng/mL (ref 0.0–9.2)

## 2020-12-21 NOTE — Telephone Encounter (Signed)
Patient made aware of Dr. Ernestina Penna recommendations for Phlebotomy within 2-4 weeks.  Advised that schedulers will call her to set appointment.  Acknowledged understanding.  No further questions or concerns at this time.

## 2020-12-21 NOTE — Telephone Encounter (Signed)
-----   Message from Truitt Merle, MD sent at 12/20/2020 10:55 AM EDT ----- Please let pt know her iron level is slightly high today, I recommend phlebotomy with NS 1hr, please schedule for her anytime in next 2-4 weeks, thanks   Truitt Merle  12/20/2020

## 2020-12-22 ENCOUNTER — Telehealth: Payer: Self-pay | Admitting: Hematology

## 2020-12-22 ENCOUNTER — Encounter: Payer: Self-pay | Admitting: Hematology

## 2020-12-22 NOTE — Telephone Encounter (Signed)
Scheduled appt per 5/24 sch msg. Called pt, no answer. Left msg with appt date and time.

## 2021-01-14 ENCOUNTER — Other Ambulatory Visit: Payer: Self-pay

## 2021-01-14 ENCOUNTER — Inpatient Hospital Stay: Payer: Medicare Other | Attending: Hematology

## 2021-01-14 MED ORDER — SODIUM CHLORIDE 0.9 % IV SOLN
Freq: Once | INTRAVENOUS | Status: AC
  Filled 2021-01-14: qty 250

## 2021-01-14 NOTE — Progress Notes (Signed)
Patient presented for phlebotomy today. Phlebotomy started mid fluids per Dr. Burr Medico. A 16 gauge needle was placed in the left AC and 407 mls blood removed. Dr. Burr Medico notified of amount removed.  Needle was removed fully intact. Patient began to dizzy as though she were going to pass out. Dr. Burr Medico notified and instructed to give a liter of fluids wide open. Blood pressure stabilized. Dr. Burr Medico agreed patient can leave after 30 minutes of observation. Patient discharged in stable condition.

## 2021-01-14 NOTE — Patient Instructions (Signed)
Therapeutic Phlebotomy Therapeutic phlebotomy is the planned removal of blood from a person's body for the purpose of treating a medical condition. The procedure is similar to donating blood. Usually, about a pint (470 mL, or 0.47 L) of blood is removed.The average adult has 9-12 pints (4.3-5.7 L) of blood in the body. Therapeutic phlebotomy may be used to treat the following medical conditions: Hemochromatosis. This is a condition in which the blood contains too much iron. Polycythemia vera. This is a condition in which the blood contains too many red blood cells. Porphyria cutanea tarda. This is a disease in which an important part of hemoglobin is not made properly. It results in the buildup of abnormal amounts of porphyrins in the body. Sickle cell disease. This is a condition in which the red blood cells form an abnormal crescent shape rather than a round shape. Tell a health care provider about: Any allergies you have. All medicines you are taking, including vitamins, herbs, eye drops, creams, and over-the-counter medicines. Any problems you or family members have had with anesthetic medicines. Any blood disorders you have. Any surgeries you have had. Any medical conditions you have. Whether you are pregnant or may be pregnant. What are the risks? Generally, this is a safe procedure. However, problems may occur, including: Nausea or light-headedness. Low blood pressure (hypotension). Soreness, bleeding, swelling, or bruising at the needle insertion site. Infection. What happens before the procedure? Follow instructions from your health care provider about eating or drinking restrictions. Ask your health care provider about: Changing or stopping your regular medicines. This is especially important if you are taking diabetes medicines or blood thinners (anticoagulants). Taking medicines such as aspirin and ibuprofen. These medicines can thin your blood. Do not take these medicines unless  your health care provider tells you to take them. Taking over-the-counter medicines, vitamins, herbs, and supplements. Wear clothing with sleeves that can be raised above the elbow. Plan to have someone take you home from the hospital or clinic. You may have a blood sample taken. Your blood pressure, pulse rate, and breathing rate will be measured. What happens during the procedure?  To lower your risk of infection: Your health care team will wash or sanitize their hands. Your skin will be cleaned with an antiseptic. You may be given a medicine to numb the area (local anesthetic). A tourniquet will be placed on your arm. A needle will be inserted into one of your veins. Tubing and a collection bag will be attached to that needle. Blood will flow through the needle and tubing into the collection bag. The collection bag will be placed lower than your arm to allow gravity to help the flow of blood into the bag. You may be asked to open and close your hand slowly and continually during the entire collection. After the specified amount of blood has been removed from your body, the collection bag and tubing will be clamped. The needle will be removed from your vein. Pressure will be held on the site of the needle insertion to stop the bleeding. A bandage (dressing) will be placed over the needle insertion site. The procedure may vary among health care providers and hospitals. What happens after the procedure? Your blood pressure, pulse rate, and breathing rate will be measured after the procedure. You will be encouraged to drink fluids. Your recovery will be assessed and monitored. You can return to your normal activities as told by your health care provider. Summary Therapeutic phlebotomy is the planned removal of  blood from a person's body for the purpose of treating a medical condition. Therapeutic phlebotomy may be used to treat hemochromatosis, polycythemia vera, porphyria cutanea tarda,  or sickle cell disease. In the procedure, a needle is inserted and about a pint (470 mL, or 0.47 L) of blood is removed. The average adult has 9-12 pints (4.3-5.7 L) of blood in the body. This is generally a safe procedure, but it can sometimes cause problems such as nausea, light-headedness, or low blood pressure (hypotension). This information is not intended to replace advice given to you by your health care provider. Make sure you discuss any questions you have with your healthcare provider. Document Revised: 08/02/2017 Document Reviewed: 08/02/2017 Elsevier Patient Education  [place].

## 2021-03-14 DIAGNOSIS — K219 Gastro-esophageal reflux disease without esophagitis: Secondary | ICD-10-CM | POA: Diagnosis not present

## 2021-03-28 DIAGNOSIS — E119 Type 2 diabetes mellitus without complications: Secondary | ICD-10-CM | POA: Diagnosis not present

## 2021-03-28 DIAGNOSIS — I10 Essential (primary) hypertension: Secondary | ICD-10-CM | POA: Diagnosis not present

## 2021-03-28 DIAGNOSIS — E7800 Pure hypercholesterolemia, unspecified: Secondary | ICD-10-CM | POA: Diagnosis not present

## 2021-04-07 DIAGNOSIS — R945 Abnormal results of liver function studies: Secondary | ICD-10-CM | POA: Diagnosis not present

## 2021-04-07 DIAGNOSIS — I10 Essential (primary) hypertension: Secondary | ICD-10-CM | POA: Diagnosis not present

## 2021-04-07 DIAGNOSIS — K219 Gastro-esophageal reflux disease without esophagitis: Secondary | ICD-10-CM | POA: Diagnosis not present

## 2021-04-21 DIAGNOSIS — Z1231 Encounter for screening mammogram for malignant neoplasm of breast: Secondary | ICD-10-CM | POA: Diagnosis not present

## 2021-04-29 DIAGNOSIS — Z23 Encounter for immunization: Secondary | ICD-10-CM | POA: Diagnosis not present

## 2021-05-04 DIAGNOSIS — Z961 Presence of intraocular lens: Secondary | ICD-10-CM | POA: Diagnosis not present

## 2021-05-04 DIAGNOSIS — H40013 Open angle with borderline findings, low risk, bilateral: Secondary | ICD-10-CM | POA: Diagnosis not present

## 2021-05-04 DIAGNOSIS — H04123 Dry eye syndrome of bilateral lacrimal glands: Secondary | ICD-10-CM | POA: Diagnosis not present

## 2021-05-04 DIAGNOSIS — H43393 Other vitreous opacities, bilateral: Secondary | ICD-10-CM | POA: Diagnosis not present

## 2021-10-04 DIAGNOSIS — R945 Abnormal results of liver function studies: Secondary | ICD-10-CM | POA: Diagnosis not present

## 2021-10-04 DIAGNOSIS — E7800 Pure hypercholesterolemia, unspecified: Secondary | ICD-10-CM | POA: Diagnosis not present

## 2021-10-04 DIAGNOSIS — K219 Gastro-esophageal reflux disease without esophagitis: Secondary | ICD-10-CM | POA: Diagnosis not present

## 2021-10-04 DIAGNOSIS — E119 Type 2 diabetes mellitus without complications: Secondary | ICD-10-CM | POA: Diagnosis not present

## 2021-10-04 DIAGNOSIS — E559 Vitamin D deficiency, unspecified: Secondary | ICD-10-CM | POA: Diagnosis not present

## 2021-10-04 DIAGNOSIS — I10 Essential (primary) hypertension: Secondary | ICD-10-CM | POA: Diagnosis not present

## 2021-10-11 DIAGNOSIS — E119 Type 2 diabetes mellitus without complications: Secondary | ICD-10-CM | POA: Diagnosis not present

## 2021-10-11 DIAGNOSIS — E7800 Pure hypercholesterolemia, unspecified: Secondary | ICD-10-CM | POA: Diagnosis not present

## 2021-10-11 DIAGNOSIS — Z0000 Encounter for general adult medical examination without abnormal findings: Secondary | ICD-10-CM | POA: Diagnosis not present

## 2021-12-12 ENCOUNTER — Other Ambulatory Visit: Payer: Self-pay

## 2021-12-13 ENCOUNTER — Ambulatory Visit (HOSPITAL_COMMUNITY)
Admission: RE | Admit: 2021-12-13 | Discharge: 2021-12-13 | Disposition: A | Discharge status: Home / Self Care | Payer: Medicare Other | Source: Ambulatory Visit | Attending: Hematology | Admitting: Hematology

## 2021-12-13 ENCOUNTER — Inpatient Hospital Stay: Payer: Medicare Other | Attending: Hematology

## 2021-12-13 ENCOUNTER — Other Ambulatory Visit: Payer: Self-pay

## 2021-12-13 DIAGNOSIS — R7303 Prediabetes: Secondary | ICD-10-CM | POA: Diagnosis not present

## 2021-12-13 DIAGNOSIS — Z79899 Other long term (current) drug therapy: Secondary | ICD-10-CM | POA: Diagnosis not present

## 2021-12-13 DIAGNOSIS — I10 Essential (primary) hypertension: Secondary | ICD-10-CM | POA: Insufficient documentation

## 2021-12-13 DIAGNOSIS — R55 Syncope and collapse: Secondary | ICD-10-CM | POA: Diagnosis not present

## 2021-12-13 DIAGNOSIS — Z880 Allergy status to penicillin: Secondary | ICD-10-CM | POA: Diagnosis not present

## 2021-12-13 LAB — CBC WITH DIFFERENTIAL (CANCER CENTER ONLY)
Abs Immature Granulocytes: 0.01 10*3/uL (ref 0.00–0.07)
Basophils Absolute: 0 10*3/uL (ref 0.0–0.1)
Basophils Relative: 1 %
Eosinophils Absolute: 0.1 10*3/uL (ref 0.0–0.5)
Eosinophils Relative: 3 %
HCT: 42.2 % (ref 36.0–46.0)
Hemoglobin: 14.6 g/dL (ref 12.0–15.0)
Immature Granulocytes: 0 %
Lymphocytes Relative: 35 %
Lymphs Abs: 1.7 10*3/uL (ref 0.7–4.0)
MCH: 34.1 pg — ABNORMAL HIGH (ref 26.0–34.0)
MCHC: 34.6 g/dL (ref 30.0–36.0)
MCV: 98.6 fL (ref 80.0–100.0)
Monocytes Absolute: 0.5 10*3/uL (ref 0.1–1.0)
Monocytes Relative: 11 %
Neutro Abs: 2.4 10*3/uL (ref 1.7–7.7)
Neutrophils Relative %: 50 %
Platelet Count: 237 10*3/uL (ref 150–400)
RBC: 4.28 MIL/uL (ref 3.87–5.11)
RDW: 12.1 % (ref 11.5–15.5)
WBC Count: 4.8 10*3/uL (ref 4.0–10.5)
nRBC: 0 % (ref 0.0–0.2)

## 2021-12-13 LAB — CMP (CANCER CENTER ONLY)
ALT: 55 U/L — ABNORMAL HIGH (ref 0–44)
AST: 35 U/L (ref 15–41)
Albumin: 4.6 g/dL (ref 3.5–5.0)
Alkaline Phosphatase: 43 U/L (ref 38–126)
Anion gap: 7 (ref 5–15)
BUN: 14 mg/dL (ref 8–23)
CO2: 30 mmol/L (ref 22–32)
Calcium: 9.4 mg/dL (ref 8.9–10.3)
Chloride: 103 mmol/L (ref 98–111)
Creatinine: 0.78 mg/dL (ref 0.44–1.00)
GFR, Estimated: >60 mL/min (ref >60)
Glucose, Bld: 132 mg/dL — ABNORMAL HIGH (ref 70–99)
Potassium: 3.8 mmol/L (ref 3.5–5.1)
Sodium: 140 mmol/L (ref 135–145)
Total Bilirubin: 0.6 mg/dL (ref 0.3–1.2)
Total Protein: 7.8 g/dL (ref 6.5–8.1)

## 2021-12-13 LAB — FERRITIN: Ferritin: 26 ng/mL (ref 11–307)

## 2021-12-21 ENCOUNTER — Encounter: Payer: Self-pay | Admitting: Hematology

## 2021-12-21 ENCOUNTER — Inpatient Hospital Stay (HOSPITAL_BASED_OUTPATIENT_CLINIC_OR_DEPARTMENT_OTHER): Payer: Medicare Other | Admitting: Hematology

## 2021-12-21 NOTE — Progress Notes (Signed)
[place]   [phone]) [phone] [phone]   Clinic Follow up Note   Patient Care Team: [name], DO as PCP - General (Family Medicine)  Date of Service:  12/21/2021  I connected with Jessica Conley on 12/21/2021 at 12:20 PM EDT by telephone visit and verified that I am speaking with the correct person using two identifiers.  I discussed the limitations, risks, security and privacy concerns of performing an evaluation and management service by telephone and the availability of in person appointments. I also discussed with the patient that there may be a patient responsible charge related to this service. The patient expressed understanding and agreed to proceed.   Other persons participating in the visit and their role in the encounter:  none  Patient's location:  out? Provider's location:  my office  CHIEF COMPLAINT: f/u of hereditary hemachromatosis   CURRENT THERAPY:  Partial Phlebotomy (352m blood removed with NS 5034minfusion) for Ferritin>100.   ASSESSMENT & PLAN:  Jessica POULTONs a [age].o. female with   1. Hereditary hemochromatosis, homozygote C282Y -Diagnosed in 09/2010. Peak ferritin 461 at time of diagnosis. She had a liver biopsy on 09/09/10: 4+ iron; mild periportal inflammation consistent with underlying steatosis.  -Dr GrRiki Sheerestarted her on phlebotomies since age [age]She had poor tolerance and had a brief syncopal episode. She stabilized quickly with saline administration. Dr. G Darnell Levelecommended to keep her ferritin below 150.  -Will continue phlebotomy treatment with goal of Ferritin below 100 due to her age and poor tolerance (typical goal is 50101 Will give IV Fluids with each phlebotomy and I strongly encouraged she eat and drink plenty of fluids beforehand. -Will check her liver with [facility] 2 years to monitor for iron overload which can lead to liver cirrhosis and other complications.  -last abdomen [facility] 12/13/21  was stable. I reviewed the results with her today. -She is clinically doing well overall. Labs from 12/13/21 reviewed, Hg and HCT normal, ferritin 26.  No need phlebotomy for now. -Lab every 6 months. F/u in 1 year   2. HTN, Borderline DM  -On Hyzaar, Metformin, oral potassium  -Her BP is often elevated in our clinic. She continues HTN medication and f/u with PCP.    3. Cancer Screening  -I encouraged her to continue age appropriate cancer screenings  -She will continue to yearly mammograms -By her next coloscopy she will be 78, GI did not recommend she continue screening.      PLAN:  -She will have labs every 6 months with Dr. CoTheda Sersnd send our clinic the reports. I will contact Dr. CoTheda Sers-Proceed with Phlebotomy if Ferritin>100 with 30015mlood removed, and NS 500m69mfusion  -F/u in 1 year with lab a week before.    No problem-specific Assessment & Plan notes found for this encounter.   INTERVAL HISTORY:  Jessica Conley contacted for a follow up of hemachromatosis. She was last seen by me a year ago.  She reports she is doing well overall, no new changes or concerns.   All other systems were reviewed with the patient and are negative.  MEDICAL HISTORY:  Past Medical History:  Diagnosis Date   Celiac sprue    Hemochromatosis, hereditary (HCC)[place]/19/2011   Hypertension     SURGICAL HISTORY: Past Surgical History:  Procedure Laterality Date   EYE SURGERY     PHLEBOTOMY THERAPEUTIC  08/21/2011       VAGINAL HYSTERECTOMY  early age 56s   prolapse    I have reviewed the social history and family history with the patient and they are unchanged from previous note.  ALLERGIES:  is allergic to wheat and penicillins.  MEDICATIONS:  Current Outpatient Medications  Medication Sig Dispense Refill   amLODipine (NORVASC) 2.5 MG tablet Take 2.5 mg by mouth daily.     ergocalciferol (VITAMIN D2) 1.25 MG (50000 UT) capsule Take by mouth.     FOLIC ACID PO Take 1  tablet by mouth daily.     losartan-hydrochlorothiazide (HYZAAR) 100-25 MG per tablet Take 1 tablet by mouth daily.     metFORMIN (GLUCOPHAGE) 500 MG tablet Take 500 mg by mouth daily.     Multiple Vitamin (MULITIVITAMIN WITH MINERALS) TABS Take 1 tablet by mouth daily.     omeprazole (PRILOSEC) 40 MG capsule Take 40 mg by mouth daily.     potassium chloride SA (K-DUR) 20 MEQ tablet TAKE 1/2 TABLET(10 MEQ) BY MOUTH DAILY 45 tablet 1   pravastatin (PRAVACHOL) 20 MG tablet Take 40 mg by mouth daily.      No current facility-administered medications for this visit.    PHYSICAL EXAMINATION: ECOG PERFORMANCE STATUS: 0 - Asymptomatic  There were no vitals filed for this visit. Wt Readings from Last 3 Encounters:  12/20/20 150 lb 1.6 oz (68.1 kg)  12/16/18 150 lb 11.2 oz (68.4 kg)  06/18/18 150 lb (68 kg)     No vitals taken today, Exam not performed today  LABORATORY DATA:  I have reviewed the data as listed    Latest Ref Rng & Units 12/13/2021    9:00 AM 12/20/2020    9:01 AM 12/15/2019   11:11 AM  CBC  WBC 4.0 - 10.5 K/uL 4.8   5.7   6.0    Hemoglobin 12.0 - 15.0 g/dL 14.6   14.7   14.1    Hematocrit 36.0 - 46.0 % 42.2   42.8   41.6    Platelets 150 - 400 K/uL 237   244   245          Latest Ref Rng & Units 12/13/2021    9:00 AM 12/20/2020    9:01 AM 12/15/2019   11:11 AM  CMP  Glucose 70 - 99 mg/dL 132   120   105    BUN 8 - 23 mg/dL 14   13   15     Creatinine 0.44 - 1.00 mg/dL 0.78   0.77   0.79    Sodium 135 - 145 mmol/L 140   138   140    Potassium 3.5 - 5.1 mmol/L 3.8   3.3   3.9    Chloride 98 - 111 mmol/L 103   101   103    CO2 22 - 32 mmol/L 30   27   27     Calcium 8.9 - 10.3 mg/dL 9.4   9.4   9.1    Total Protein 6.5 - 8.1 g/dL 7.8   7.7   7.4    Total Bilirubin 0.3 - 1.2 mg/dL 0.6   0.8   0.5    Alkaline Phos 38 - 126 U/L 43   45   43    AST 15 - 41 U/L 35   43   29    ALT 0 - 44 U/L 55   70   43        RADIOGRAPHIC STUDIES: I have personally reviewed the  radiological images  as listed and agreed with the findings in the report. No results found.    No orders of the defined types were placed in this encounter.  All questions were answered. The patient knows to call the clinic with any problems, questions or concerns. No barriers to learning was detected. The total time spent in the appointment was 15 minutes.     Truitt Merle, MD 12/21/2021   I, Wilburn Mylar, am acting as scribe for Truitt Merle, MD.   I have reviewed the above documentation for accuracy and completeness, and I agree with the above.

## 2021-12-22 ENCOUNTER — Other Ambulatory Visit: Payer: Self-pay

## 2021-12-22 NOTE — Progress Notes (Signed)
Faxed Dr. Ernestina Penna last office note to pt's PCP and requested if they would fax pt's CBC and Ferritin results from the pt's next lab draw with them in 5 to 6 months from now.  Epic fax confirmation confirmed.

## 2021-12-28 ENCOUNTER — Ambulatory Visit: Payer: Medicare Other | Admitting: Hematology

## 2022-01-20 ENCOUNTER — Ambulatory Visit: Payer: Medicare Other | Admitting: Hematology

## 2022-04-07 DIAGNOSIS — E7800 Pure hypercholesterolemia, unspecified: Secondary | ICD-10-CM | POA: Diagnosis not present

## 2022-04-07 DIAGNOSIS — E119 Type 2 diabetes mellitus without complications: Secondary | ICD-10-CM | POA: Diagnosis not present

## 2022-04-07 DIAGNOSIS — I10 Essential (primary) hypertension: Secondary | ICD-10-CM | POA: Diagnosis not present

## 2022-04-14 DIAGNOSIS — K760 Fatty (change of) liver, not elsewhere classified: Secondary | ICD-10-CM | POA: Diagnosis not present

## 2022-04-14 DIAGNOSIS — E7800 Pure hypercholesterolemia, unspecified: Secondary | ICD-10-CM | POA: Diagnosis not present

## 2022-04-14 DIAGNOSIS — I10 Essential (primary) hypertension: Secondary | ICD-10-CM | POA: Diagnosis not present

## 2022-04-27 DIAGNOSIS — Z1231 Encounter for screening mammogram for malignant neoplasm of breast: Secondary | ICD-10-CM | POA: Diagnosis not present

## 2022-05-18 DIAGNOSIS — R1013 Epigastric pain: Secondary | ICD-10-CM | POA: Diagnosis not present

## 2022-06-01 DIAGNOSIS — K219 Gastro-esophageal reflux disease without esophagitis: Secondary | ICD-10-CM | POA: Diagnosis not present

## 2022-06-01 DIAGNOSIS — Z86010 Personal history of colonic polyps: Secondary | ICD-10-CM | POA: Diagnosis not present

## 2022-06-01 DIAGNOSIS — R079 Chest pain, unspecified: Secondary | ICD-10-CM | POA: Diagnosis not present

## 2022-06-12 ENCOUNTER — Encounter: Payer: Self-pay | Admitting: Hematology

## 2022-06-12 ENCOUNTER — Encounter: Payer: Self-pay | Admitting: Internal Medicine

## 2022-06-12 ENCOUNTER — Ambulatory Visit: Payer: Medicare Other | Admitting: Internal Medicine

## 2022-06-12 VITALS — BP 165/87 | HR 91 | Resp 15 | Ht 68.0 in | Wt 147.0 lb

## 2022-06-12 DIAGNOSIS — E782 Mixed hyperlipidemia: Secondary | ICD-10-CM | POA: Insufficient documentation

## 2022-06-12 DIAGNOSIS — I10 Essential (primary) hypertension: Secondary | ICD-10-CM | POA: Diagnosis not present

## 2022-06-12 DIAGNOSIS — R0789 Other chest pain: Secondary | ICD-10-CM | POA: Insufficient documentation

## 2022-06-12 DIAGNOSIS — E119 Type 2 diabetes mellitus without complications: Secondary | ICD-10-CM

## 2022-06-12 MED ORDER — METOPROLOL SUCCINATE ER 50 MG PO TB24: 50.0000 mg | ORAL_TABLET | Freq: Every day | ORAL | 2 refills | Status: DC

## 2022-06-12 NOTE — Progress Notes (Signed)
Primary Physician/Referring:  [name], DO  Patient ID: Jessica Conley, female    DOB: Oct 08, 1945, 76 y.o.   MRN: [id number]  No chief complaint on file.  HPI:    Jessica Conley  is a 76 y.o. female with HTN, HLD, and DM2 who is here to establish care with cardiology. She has been having a band-like pain under her breasts and her primary is concerned this could be cardiac in nature. Patient states it can come on at any time and does not necessarily have to be associated with activity. This pain has occurred when she has been doing physical activity in the past though. Patient cannot exactly pinpoint when this happens most often. She has never had an echocardiogram or stress test in the past. She does not smoke or drink alcohol. Denies claudication, shortness of breath, palpitations, diaphoresis, syncope, edema, PND, orthopnea.   Past Medical History:  Diagnosis Date   Celiac sprue    Hemochromatosis, hereditary ([place]) 05/18/2010   Hypertension    Past Surgical History:  Procedure Laterality Date   EYE SURGERY     PHLEBOTOMY THERAPEUTIC  08/21/2011       VAGINAL HYSTERECTOMY  early age 2s   prolapse   Family History  Problem Relation Age of Onset   Heart attack Mother    Melanoma Father    Breast cancer Daughter 68    Social History   Tobacco Use   Smoking status: Never   Smokeless tobacco: Never  Substance Use Topics   Alcohol use: Yes    Alcohol/week: 4.0 standard drinks of alcohol    Types: 4 Glasses of wine per week    Comment: Wine   Marital Status: Married  ROS  Review of Systems  Cardiovascular:  Positive for chest pain.  Gastrointestinal:  Positive for heartburn.   Objective  Blood pressure (!) 165/87, pulse 91, resp. rate 15, height 5' 8"  (1.727 m), weight 147 lb (66.7 kg), SpO2 97 %. Body mass index is 22.35 kg/m.     06/12/2022    1:26 PM 01/14/2021   10:56 AM 01/14/2021   10:25 AM  Vitals with BMI  Height 5' 8"     Weight 147 lbs     BMI 66.44    Systolic 034 742 595  Diastolic 87 76 68  Pulse 91 65 68     Physical Exam Vitals reviewed.  HENT:     Head: Normocephalic and atraumatic.  Cardiovascular:     Rate and Rhythm: Normal rate and regular rhythm.     Pulses: Normal pulses.     Heart sounds: Murmur heard.  Pulmonary:     Effort: Pulmonary effort is normal.     Breath sounds: Normal breath sounds.  Abdominal:     General: Bowel sounds are normal.  Musculoskeletal:     Right lower leg: No edema.     Left lower leg: No edema.  Skin:    General: Skin is warm and dry.  Neurological:     Mental Status: She is alert.     Medications and allergies   Allergies  Allergen Reactions   Wheat     ciliac disease   Penicillins Rash    Waterblisters      Medication list after today's encounter   Current Outpatient Medications:    amLODipine (NORVASC) 2.5 MG tablet, Take 2.5 mg by mouth daily., Disp: , Rfl:    ergocalciferol (VITAMIN D2) 1.25 MG (50000 UT) capsule, Take by mouth., Disp: ,  Rfl:    losartan-hydrochlorothiazide (HYZAAR) 100-25 MG per tablet, Take 1 tablet by mouth daily., Disp: , Rfl:    metFORMIN (GLUCOPHAGE) 500 MG tablet, Take 500 mg by mouth daily., Disp: , Rfl:    metoprolol succinate (TOPROL-XL) 50 MG 24 hr tablet, Take 1 tablet (50 mg total) by mouth daily. Take with or immediately following a meal., Disp: 30 tablet, Rfl: 2   omeprazole (PRILOSEC) 40 MG capsule, Take 40 mg by mouth daily., Disp: , Rfl:    pravastatin (PRAVACHOL) 20 MG tablet, Take 40 mg by mouth daily. , Disp: , Rfl:    Multiple Vitamin (MULITIVITAMIN WITH MINERALS) TABS, Take 1 tablet by mouth daily., Disp: , Rfl:    potassium chloride SA (K-DUR) 20 MEQ tablet, TAKE 1/2 TABLET(10 MEQ) BY MOUTH DAILY, Disp: 45 tablet, Rfl: 1  Laboratory examination:   Lab Results  Component Value Date   NA 140 12/13/2021   K 3.8 12/13/2021   CO2 30 12/13/2021   GLUCOSE 132 (H) 12/13/2021   BUN 14 12/13/2021   CREATININE 0.78  12/13/2021   CALCIUM 9.4 12/13/2021   GFRNONAA >60 12/13/2021       Latest Ref Rng & Units 12/13/2021    9:00 AM 12/20/2020    9:01 AM 12/15/2019   11:11 AM  CMP  Glucose 70 - 99 mg/dL 132  120  105   BUN 8 - 23 mg/dL 14  13  15    Creatinine 0.44 - 1.00 mg/dL 0.78  0.77  0.79   Sodium 135 - 145 mmol/L 140  138  140   Potassium 3.5 - 5.1 mmol/L 3.8  3.3  3.9   Chloride 98 - 111 mmol/L 103  101  103   CO2 22 - 32 mmol/L 30  27  27    Calcium 8.9 - 10.3 mg/dL 9.4  9.4  9.1   Total Protein 6.5 - 8.1 g/dL 7.8  7.7  7.4   Total Bilirubin 0.3 - 1.2 mg/dL 0.6  0.8  0.5   Alkaline Phos 38 - 126 U/L 43  45  43   AST 15 - 41 U/L 35  43  29   ALT 0 - 44 U/L 55  70  43       Latest Ref Rng & Units 12/13/2021    9:00 AM 12/20/2020    9:01 AM 12/15/2019   11:11 AM  CBC  WBC 4.0 - 10.5 K/uL 4.8  5.7  6.0   Hemoglobin 12.0 - 15.0 g/dL 14.6  14.7  14.1   Hematocrit 36.0 - 46.0 % 42.2  42.8  41.6   Platelets 150 - 400 K/uL 237  244  245     Lipid Panel No results for input(s): "CHOL", "TRIG", "[place]", "VLDL", "HDL", "CHOLHDL", "LDLDIRECT" in the last 8760 hours.  HEMOGLOBIN A1C No results found for: "HGBA1C", "MPG" TSH No results for input(s): "TSH" in the last 8760 hours.  External labs:     Radiology:    Cardiac Studies:   No results found for this or any previous visit from the past 1095 days.      EKG:   06/12/2022: Sinus Rhythm. Left atrial enlargement. Voltage criteria for LVH -Nonspecific ST depression possibly due to LVH   Assessment     ICD-10-CM   1. Atypical chest pain  R07.89 EKG 12-Lead    PCV ECHOCARDIOGRAM COMPLETE    PCV MYOCARDIAL PERFUSION WO LEXISCAN    2. Essential hypertension  I10 PCV ECHOCARDIOGRAM COMPLETE  PCV MYOCARDIAL PERFUSION WO LEXISCAN    3. Type 2 diabetes mellitus without complication, without long-term current use of insulin (HCC)  E11.9 PCV ECHOCARDIOGRAM COMPLETE    PCV MYOCARDIAL PERFUSION WO LEXISCAN    4. Mixed  hyperlipidemia  E78.2 PCV ECHOCARDIOGRAM COMPLETE    PCV MYOCARDIAL PERFUSION WO LEXISCAN       Orders Placed This Encounter  Procedures   PCV MYOCARDIAL PERFUSION WO LEXISCAN    Standing Status:   Future    Standing Expiration Date:   08/12/2022   EKG 12-Lead   PCV ECHOCARDIOGRAM COMPLETE    Standing Status:   Future    Standing Expiration Date:   06/13/2023    Meds ordered this encounter  Medications   metoprolol succinate (TOPROL-XL) 50 MG 24 hr tablet    Sig: Take 1 tablet (50 mg total) by mouth daily. Take with or immediately following a meal.    Dispense:  30 tablet    Refill:  2    Medications Discontinued During This Encounter  Medication Reason   FOLIC ACID PO      Recommendations:   CHANNING SAVICH is a 76 y.o.  female with HTN and DM2  Atypical chest pain Could be heartburn versus anginal equivalent in this patient Risk factors include: HTN, HLD, DM2 Will obtain stress test   Essential hypertension Continue current cardiac medications. Adding Toprol XL 68m qday Encourage low-sodium diet, less than 2000 mg daily. Schedule echocardiogram    Type 2 diabetes mellitus without complication, without long-term current use of insulin (HCC) Continue metformin    Mixed hyperlipidemia Continue pravastatin  Follow-up in 1-2 months or sooner if needed     SFloydene Flock DO, FWestside Surgery Center Ltd 06/12/2022, 1:58 PM Office: [phone]: [phone]

## 2022-06-27 ENCOUNTER — Ambulatory Visit: Payer: Medicare Other

## 2022-06-27 DIAGNOSIS — R0789 Other chest pain: Secondary | ICD-10-CM | POA: Diagnosis not present

## 2022-06-27 DIAGNOSIS — I10 Essential (primary) hypertension: Secondary | ICD-10-CM

## 2022-06-27 DIAGNOSIS — E782 Mixed hyperlipidemia: Secondary | ICD-10-CM | POA: Diagnosis not present

## 2022-06-27 DIAGNOSIS — E119 Type 2 diabetes mellitus without complications: Secondary | ICD-10-CM

## 2022-07-04 ENCOUNTER — Ambulatory Visit: Payer: Medicare Other

## 2022-07-04 DIAGNOSIS — I10 Essential (primary) hypertension: Secondary | ICD-10-CM | POA: Diagnosis not present

## 2022-07-04 DIAGNOSIS — R0789 Other chest pain: Secondary | ICD-10-CM | POA: Diagnosis not present

## 2022-07-04 DIAGNOSIS — E119 Type 2 diabetes mellitus without complications: Secondary | ICD-10-CM

## 2022-07-04 DIAGNOSIS — E782 Mixed hyperlipidemia: Secondary | ICD-10-CM | POA: Diagnosis not present

## 2022-07-12 ENCOUNTER — Telehealth: Payer: Self-pay

## 2022-07-12 NOTE — Telephone Encounter (Signed)
Patient calling for stress results

## 2022-07-27 ENCOUNTER — Encounter: Payer: Self-pay | Admitting: Internal Medicine

## 2022-07-27 ENCOUNTER — Ambulatory Visit: Payer: Medicare Other | Admitting: Internal Medicine

## 2022-07-27 VITALS — BP 162/77 | HR 58 | Ht 68.0 in | Wt 152.2 lb

## 2022-07-27 DIAGNOSIS — E782 Mixed hyperlipidemia: Secondary | ICD-10-CM

## 2022-07-27 DIAGNOSIS — R9439 Abnormal result of other cardiovascular function study: Secondary | ICD-10-CM | POA: Diagnosis not present

## 2022-07-27 DIAGNOSIS — I10 Essential (primary) hypertension: Secondary | ICD-10-CM

## 2022-07-27 MED ORDER — ASPIRIN 81 MG PO TBEC: 81.0000 mg | DELAYED_RELEASE_TABLET | Freq: Every day | ORAL | 3 refills | Status: DC

## 2022-07-27 MED ORDER — AMLODIPINE BESYLATE 5 MG PO TABS: 5.0000 mg | ORAL_TABLET | Freq: Every day | ORAL | 3 refills | Status: DC

## 2022-07-27 NOTE — Progress Notes (Signed)
Primary Physician/Referring:  [name], DO  Patient ID: Jessica Conley, female    DOB: April 20, 1946, 76 y.o.   MRN: [id number]  Chief Complaint  Patient presents with   Chest Pain   Follow-up   Results    HPI:    Jessica Conley  is a 76 y.o. female with HTN, HLD, and DM2 who is here to establish care with cardiology. She has been having a band-like pain under her breasts and her primary is concerned this could be cardiac in nature. Patient noticed this occurred when she was helping out around the house and also when she was picking up sticks in her backyard. This seems to be more associated with activity than previously stated.  Denies claudication, shortness of breath, palpitations, diaphoresis, syncope, edema, PND, orthopnea.   Past Medical History:  Diagnosis Date   Celiac sprue    Hemochromatosis, hereditary ([place]) 05/18/2010   Hypertension    Past Surgical History:  Procedure Laterality Date   EYE SURGERY     PHLEBOTOMY THERAPEUTIC  08/21/2011       VAGINAL HYSTERECTOMY  early age 66s   prolapse   Family History  Problem Relation Age of Onset   Heart attack Mother    Melanoma Father    Breast cancer Daughter 53    Social History   Tobacco Use   Smoking status: Never   Smokeless tobacco: Never  Substance Use Topics   Alcohol use: Yes    Alcohol/week: 4.0 standard drinks of alcohol    Types: 4 Glasses of wine per week    Comment: Wine   Marital Status: Married  ROS  Review of Systems  Cardiovascular:  Positive for chest pain.  Gastrointestinal:  Positive for heartburn.   Objective  Blood pressure (!) 162/77, pulse (!) 58, height 5' 8"  (1.727 m), weight 152 lb 3.2 oz (69 kg), SpO2 98 %. Body mass index is 23.14 kg/m.     07/27/2022    8:58 AM 07/27/2022    8:53 AM 06/12/2022    1:26 PM  Vitals with BMI  Height  5' 8"  5' 8"   Weight  152 lbs 3 oz 147 lbs  BMI  41.93 79.02  Systolic 409 735 329  Diastolic 77 79 87  Pulse 58 60 91      Physical Exam Vitals reviewed.  HENT:     Head: Normocephalic and atraumatic.  Cardiovascular:     Rate and Rhythm: Normal rate and regular rhythm.     Pulses: Normal pulses.     Heart sounds: Murmur heard.  Pulmonary:     Effort: Pulmonary effort is normal.     Breath sounds: Normal breath sounds.  Abdominal:     General: Bowel sounds are normal.  Musculoskeletal:     Right lower leg: No edema.     Left lower leg: No edema.  Skin:    General: Skin is warm and dry.  Neurological:     Mental Status: She is alert.     Medications and allergies   Allergies  Allergen Reactions   Wheat     ciliac disease   Penicillins Rash    Waterblisters      Medication list after today's encounter   Current Outpatient Medications:    amLODipine (NORVASC) 5 MG tablet, Take 1 tablet (5 mg total) by mouth daily., Disp: 180 tablet, Rfl: 3   aspirin EC 81 MG tablet, Take 1 tablet (81 mg total) by mouth daily. (Patient taking  differently: Take 81 mg by mouth 2 (two) times a week.), Disp: 90 tablet, Rfl: 3   losartan-hydrochlorothiazide (HYZAAR) 100-25 MG per tablet, Take 1 tablet by mouth daily., Disp: , Rfl:    metFORMIN (GLUCOPHAGE) 500 MG tablet, Take 500 mg by mouth daily., Disp: , Rfl:    metoprolol succinate (TOPROL-XL) 50 MG 24 hr tablet, Take 1 tablet (50 mg total) by mouth daily. Take with or immediately following a meal., Disp: 30 tablet, Rfl: 2   omeprazole (PRILOSEC) 40 MG capsule, Take 40 mg by mouth daily., Disp: , Rfl:    pravastatin (PRAVACHOL) 40 MG tablet, Take 40 mg by mouth daily. Every other day, Disp: , Rfl:    vitamin B-12 (CYANOCOBALAMIN) 100 MCG tablet, Take 100 mcg by mouth 2 (two) times a week., Disp: , Rfl:    Cholecalciferol (VITAMIN D-3) 125 MCG (5000 UT) TABS, Take 5,000 Units by mouth daily., Disp: , Rfl:    Polyethyl Glycol-Propyl Glycol (SYSTANE) 0.4-0.3 % SOLN, Place 1 drop into both eyes every morning., Disp: , Rfl:    potassium chloride SA (K-DUR) 20 MEQ  tablet, TAKE 1/2 TABLET(10 MEQ) BY MOUTH DAILY (Patient not taking: Reported on 07/27/2022), Disp: 45 tablet, Rfl: 1   POTASSIUM PO, Take 1 tablet by mouth 2 (two) times a week., Disp: , Rfl:   Laboratory examination:   Lab Results  Component Value Date   NA 140 12/13/2021   K 3.8 12/13/2021   CO2 30 12/13/2021   GLUCOSE 132 (H) 12/13/2021   BUN 14 12/13/2021   CREATININE 0.78 12/13/2021   CALCIUM 9.4 12/13/2021   GFRNONAA >60 12/13/2021       Latest Ref Rng & Units 12/13/2021    9:00 AM 12/20/2020    9:01 AM 12/15/2019   11:11 AM  CMP  Glucose 70 - 99 mg/dL 132  120  105   BUN 8 - 23 mg/dL 14  13  15    Creatinine 0.44 - 1.00 mg/dL 0.78  0.77  0.79   Sodium 135 - 145 mmol/L 140  138  140   Potassium 3.5 - 5.1 mmol/L 3.8  3.3  3.9   Chloride 98 - 111 mmol/L 103  101  103   CO2 22 - 32 mmol/L 30  27  27    Calcium 8.9 - 10.3 mg/dL 9.4  9.4  9.1   Total Protein 6.5 - 8.1 g/dL 7.8  7.7  7.4   Total Bilirubin 0.3 - 1.2 mg/dL 0.6  0.8  0.5   Alkaline Phos 38 - 126 U/L 43  45  43   AST 15 - 41 U/L 35  43  29   ALT 0 - 44 U/L 55  70  43       Latest Ref Rng & Units 12/13/2021    9:00 AM 12/20/2020    9:01 AM 12/15/2019   11:11 AM  CBC  WBC 4.0 - 10.5 K/uL 4.8  5.7  6.0   Hemoglobin 12.0 - 15.0 g/dL 14.6  14.7  14.1   Hematocrit 36.0 - 46.0 % 42.2  42.8  41.6   Platelets 150 - 400 K/uL 237  244  245     Lipid Panel No results for input(s): "CHOL", "TRIG", "Gering", "VLDL", "HDL", "CHOLHDL", "LDLDIRECT" in the last 8760 hours.  HEMOGLOBIN A1C No results found for: "HGBA1C", "MPG" TSH No results for input(s): "TSH" in the last 8760 hours.  External labs:     Radiology:    Cardiac Studies:  Echocardiogram 06/27/2022:  Normal LV systolic function with visual EF 60-65%. Left ventricle cavity  is normal in size. Normal left ventricular wall thickness. Normal global  wall motion. Doppler evidence of grade I (impaired) diastolic dysfunction,  normal LAP. Calculated EF  72%.  Left atrial cavity is slightly dilated.  Structurally normal tricuspid valve.  Mild tricuspid regurgitation. No  evidence of pulmonary hypertension.      Lexiscan (with Bruce protocol) Nuclear stress test 07/04/2022: Myocardial perfusion is abnormal. There is a small sized reversible mild defect in the basal and mid inferolateral region.  Overall LV systolic function is normal without regional wall motion abnormalities. Stress LV EF: 70%.  Nondiagnostic ECG stress. Patient exercised on a Bruce protocol for 8 minutes and 58 seconds and achieved 8.23 METS and 78% of MPHR hence patient was injected with regadenoson.  Resting blood pressure was 180/100 mmHg, post regadenoson injection blood pressure was 169/98 mmHg.  Stress terminated due to fatigue. No previous exam available for comparison. Low risk.    EKG:   06/12/2022: Sinus Rhythm. Left atrial enlargement. Voltage criteria for LVH -Nonspecific ST depression possibly due to LVH   Assessment     ICD-10-CM   1. Mixed hyperlipidemia  E78.2     2. Essential hypertension  I10     3. Abnormal nuclear stress test  R94.39 CBC    Basic metabolic panel       Orders Placed This Encounter  Procedures   CBC   Basic metabolic panel    Meds ordered this encounter  Medications   amLODipine (NORVASC) 5 MG tablet    Sig: Take 1 tablet (5 mg total) by mouth daily.    Dispense:  180 tablet    Refill:  3   aspirin EC 81 MG tablet    Sig: Take 1 tablet (81 mg total) by mouth daily.    Dispense:  90 tablet    Refill:  3    Medications Discontinued During This Encounter  Medication Reason   amLODipine (NORVASC) 2.5 MG tablet      Recommendations:   Jessica Conley is a 76 y.o.  female with HTN and DM2  Abnormal nuclear stress test Given patient's symptoms and positive nuclear stress, will proceed with cardiac catheterization Schedule for cardiac catheterization, and possible angioplasty. We discussed regarding risks,  benefits, alternatives to this including stress testing, CTA and continued medical therapy. Patient wants to proceed. Understands <1-2% risk of death, stroke, MI, urgent CABG, bleeding, infection, renal failure but not limited to these. Patient instructed not to do heavy lifting, heavy exertional activity, swimming until evaluation is complete.  Patient instructed to call if symptoms worse or to go to the ED for further evaluation.   Essential hypertension Continue current cardiac medications, will increase amlodipine. Tolerating Toprol XL 68m qday without issue Encourage low-sodium diet, less than 2000 mg daily.   Mixed hyperlipidemia Continue pravastatin  Follow-up in 1 months or sooner if needed     SFloydene Flock DO, [facility] 07/27/2022, 2:21 PM Office: [phone]: [phone]

## 2022-07-28 LAB — CBC
Hematocrit: 40.7 % (ref 34.0–46.6)
Hemoglobin: 14.2 g/dL (ref 11.1–15.9)
MCH: 34.2 pg — ABNORMAL HIGH (ref 26.6–33.0)
MCHC: 34.9 g/dL (ref 31.5–35.7)
MCV: 98 fL — ABNORMAL HIGH (ref 79–97)
Platelets: 234 10*3/uL (ref 150–450)
RBC: 4.15 x10E6/uL (ref 3.77–5.28)
RDW: 11.7 % (ref 11.7–15.4)
WBC: 5.9 10*3/uL (ref 3.4–10.8)

## 2022-07-28 LAB — BASIC METABOLIC PANEL
BUN/Creatinine Ratio: 19 (ref 12–28)
BUN: 13 mg/dL (ref 8–27)
CO2: 22 mmol/L (ref 20–29)
Calcium: 9.3 mg/dL (ref 8.7–10.3)
Chloride: 95 mmol/L — ABNORMAL LOW (ref 96–106)
Creatinine, Ser: 0.68 mg/dL (ref 0.57–1.00)
Glucose: 124 mg/dL — ABNORMAL HIGH (ref 70–99)
Potassium: 3.9 mmol/L (ref 3.5–5.2)
Sodium: 134 mmol/L (ref 134–144)
eGFR: 89 mL/min/{1.73_m2} (ref >59)

## 2022-08-01 ENCOUNTER — Encounter (HOSPITAL_COMMUNITY)
Admission: RE | Disposition: A | Discharge status: Home / Self Care | Payer: Self-pay | Source: Home / Self Care | Attending: Cardiology

## 2022-08-01 ENCOUNTER — Ambulatory Visit (HOSPITAL_COMMUNITY)
Admission: RE | Admit: 2022-08-01 | Discharge: 2022-08-01 | Disposition: A | Discharge status: Home / Self Care | Payer: Medicare Other | Attending: Cardiology | Admitting: Cardiology

## 2022-08-01 DIAGNOSIS — R9439 Abnormal result of other cardiovascular function study: Secondary | ICD-10-CM | POA: Insufficient documentation

## 2022-08-01 DIAGNOSIS — R0789 Other chest pain: Secondary | ICD-10-CM | POA: Diagnosis not present

## 2022-08-01 DIAGNOSIS — E119 Type 2 diabetes mellitus without complications: Secondary | ICD-10-CM | POA: Insufficient documentation

## 2022-08-01 DIAGNOSIS — Z79899 Other long term (current) drug therapy: Secondary | ICD-10-CM | POA: Insufficient documentation

## 2022-08-01 DIAGNOSIS — Z7984 Long term (current) use of oral hypoglycemic drugs: Secondary | ICD-10-CM | POA: Insufficient documentation

## 2022-08-01 DIAGNOSIS — I10 Essential (primary) hypertension: Secondary | ICD-10-CM | POA: Diagnosis not present

## 2022-08-01 DIAGNOSIS — E782 Mixed hyperlipidemia: Secondary | ICD-10-CM | POA: Diagnosis not present

## 2022-08-01 HISTORY — PX: LEFT HEART CATH AND CORONARY ANGIOGRAPHY: CATH118249

## 2022-08-01 LAB — GLUCOSE, CAPILLARY
Glucose-Capillary: 137 mg/dL — ABNORMAL HIGH (ref 70–99)
Glucose-Capillary: 134 mg/dL — ABNORMAL HIGH (ref 70–99)

## 2022-08-01 SURGERY — LEFT HEART CATH AND CORONARY ANGIOGRAPHY
Anesthesia: LOCAL

## 2022-08-01 MED ORDER — SODIUM CHLORIDE 0.9% FLUSH: 3.0000 mL | Freq: Two times a day (BID) | INTRAVENOUS | Status: DC

## 2022-08-01 MED ORDER — VERAPAMIL HCL 2.5 MG/ML IV SOLN
INTRAVENOUS | Status: DC | PRN
  Administered 2022-08-01: 10 mL via INTRA_ARTERIAL

## 2022-08-01 MED ORDER — HEPARIN (PORCINE) IN NACL 1000-0.9 UT/500ML-% IV SOLN
INTRAVENOUS | Status: AC
  Filled 2022-08-01: qty 1000

## 2022-08-01 MED ORDER — SODIUM CHLORIDE 0.9 % IV SOLN: 250.0000 mL | INTRAVENOUS | Status: DC | PRN

## 2022-08-01 MED ORDER — ASPIRIN 81 MG PO CHEW
81.0000 mg | CHEWABLE_TABLET | ORAL | Status: AC
  Administered 2022-08-01: 81 mg via ORAL
  Filled 2022-08-01: qty 1

## 2022-08-01 MED ORDER — SODIUM CHLORIDE 0.9% FLUSH: 3.0000 mL | INTRAVENOUS | Status: DC | PRN

## 2022-08-01 MED ORDER — HEPARIN SODIUM (PORCINE) 1000 UNIT/ML IJ SOLN
INTRAMUSCULAR | Status: DC | PRN
  Administered 2022-08-01: 3500 [IU] via INTRAVENOUS

## 2022-08-01 MED ORDER — SODIUM CHLORIDE 0.9 % IV SOLN: INTRAVENOUS | Status: AC

## 2022-08-01 MED ORDER — FENTANYL CITRATE (PF) 100 MCG/2ML IJ SOLN
INTRAMUSCULAR | Status: DC | PRN
  Administered 2022-08-01: 50 ug via INTRAVENOUS

## 2022-08-01 MED ORDER — LABETALOL HCL 5 MG/ML IV SOLN: 10.0000 mg | INTRAVENOUS | Status: DC | PRN

## 2022-08-01 MED ORDER — MIDAZOLAM HCL 2 MG/2ML IJ SOLN
INTRAMUSCULAR | Status: AC
  Filled 2022-08-01: qty 2

## 2022-08-01 MED ORDER — LIDOCAINE HCL (PF) 1 % IJ SOLN
INTRAMUSCULAR | Status: AC
  Filled 2022-08-01: qty 30

## 2022-08-01 MED ORDER — VERAPAMIL HCL 2.5 MG/ML IV SOLN
INTRAVENOUS | Status: AC
  Filled 2022-08-01: qty 2

## 2022-08-01 MED ORDER — LIDOCAINE HCL (PF) 1 % IJ SOLN
INTRAMUSCULAR | Status: DC | PRN
  Administered 2022-08-01: 2 mL

## 2022-08-01 MED ORDER — FENTANYL CITRATE (PF) 100 MCG/2ML IJ SOLN
INTRAMUSCULAR | Status: AC
  Filled 2022-08-01: qty 2

## 2022-08-01 MED ORDER — ASPIRIN 81 MG PO CHEW: 81.0000 mg | CHEWABLE_TABLET | ORAL | Status: DC

## 2022-08-01 MED ORDER — ONDANSETRON HCL 4 MG/2ML IJ SOLN: 4.0000 mg | Freq: Four times a day (QID) | INTRAMUSCULAR | Status: DC | PRN

## 2022-08-01 MED ORDER — MIDAZOLAM HCL 2 MG/2ML IJ SOLN
INTRAMUSCULAR | Status: DC | PRN
  Administered 2022-08-01: 1 mg via INTRAVENOUS

## 2022-08-01 MED ORDER — SODIUM CHLORIDE 0.9 % WEIGHT BASED INFUSION
3.0000 mL/kg/h | INTRAVENOUS | Status: AC
  Administered 2022-08-01: 3 mL/kg/h via INTRAVENOUS

## 2022-08-01 MED ORDER — ACETAMINOPHEN 325 MG PO TABS: 650.0000 mg | ORAL_TABLET | ORAL | Status: DC | PRN

## 2022-08-01 MED ORDER — SODIUM CHLORIDE 0.9 % WEIGHT BASED INFUSION: 1.0000 mL/kg/h | INTRAVENOUS | Status: DC

## 2022-08-01 MED ORDER — HYDRALAZINE HCL 20 MG/ML IJ SOLN: 10.0000 mg | INTRAMUSCULAR | Status: DC | PRN

## 2022-08-01 MED ORDER — HEPARIN (PORCINE) IN NACL 1000-0.9 UT/500ML-% IV SOLN
INTRAVENOUS | Status: DC | PRN
  Administered 2022-08-01 (×2): 500 mL

## 2022-08-01 MED ORDER — HEPARIN SODIUM (PORCINE) 1000 UNIT/ML IJ SOLN
INTRAMUSCULAR | Status: AC
  Filled 2022-08-01: qty 10

## 2022-08-01 MED ORDER — IOHEXOL 350 MG/ML SOLN
INTRAVENOUS | Status: DC | PRN
  Administered 2022-08-01: 20 mL

## 2022-08-01 SURGICAL SUPPLY — 11 items
CATH INFINITI 5FR ANG PIGTAIL (CATHETERS) IMPLANT
CATH OPTITORQUE TIG 4.0 5F (CATHETERS) IMPLANT
DEVICE RAD COMP TR BAND LRG (VASCULAR PRODUCTS) IMPLANT
GLIDESHEATH SLEND A-KIT 6F 22G (SHEATH) IMPLANT
GUIDEWIRE INQWIRE 1.5J.035X260 (WIRE) IMPLANT
INQWIRE 1.5J .035X260CM (WIRE) ×1
KIT HEART LEFT (KITS) ×1 IMPLANT
PACK CARDIAC CATHETERIZATION (CUSTOM PROCEDURE TRAY) ×1 IMPLANT
SHEATH PROBE COVER 6X72 (BAG) IMPLANT
TRANSDUCER W/STOPCOCK (MISCELLANEOUS) ×1 IMPLANT
TUBING CIL FLEX 10 FLL-RA (TUBING) ×1 IMPLANT

## 2022-08-01 NOTE — Progress Notes (Signed)
Patient stated that she has never met Dr. Virgina Jock and would like to speak with patient prior to procedure and before signing consent form. Rennis Harding, RN made aware and will ask MD to come speak with patient.

## 2022-08-01 NOTE — H&P (Signed)
OV 07/27/2022 copied for documentation    Primary Physician/Referring:  [name], DO  Patient ID: Rebeca Allegra, female    DOB: [birth year], 77 y.o.   MRN: [id number]  Chief Complaint  Patient presents with   Chest Pain   Follow-up   Results    HPI:    ERANDY MCEACHERN  is a 77 y.o. female with HTN, HLD, and DM2 who is here to establish care with cardiology. She has been having a band-like pain under her breasts and her primary is concerned this could be cardiac in nature. Patient noticed this occurred when she was helping out around the house and also when she was picking up sticks in her backyard. This seems to be more associated with activity than previously stated.  Denies claudication, shortness of breath, palpitations, diaphoresis, syncope, edema, PND, orthopnea.   Past Medical History:  Diagnosis Date   Celiac sprue    Hemochromatosis, hereditary ([place]) 05/18/2010   Hypertension    Past Surgical History:  Procedure Laterality Date   EYE SURGERY     PHLEBOTOMY THERAPEUTIC  08/21/2011       VAGINAL HYSTERECTOMY  early age 70s   prolapse   Family History  Problem Relation Age of Onset   Heart attack Mother    Melanoma Father    Breast cancer Daughter 34    Social History   Tobacco Use   Smoking status: Never   Smokeless tobacco: Never  Substance Use Topics   Alcohol use: Yes    Alcohol/week: 4.0 standard drinks of alcohol    Types: 4 Glasses of wine per week    Comment: Wine   Marital Status: Married  ROS  Review of Systems  Cardiovascular:  Positive for chest pain.  Gastrointestinal:  Positive for heartburn.   Objective  Blood pressure (!) 162/77, pulse (!) 58, height 5\' 8"  (1.727 m), weight 152 lb 3.2 oz (69 kg), SpO2 98 %. Body mass index is 23.14 kg/m.     07/27/2022    8:58 AM 07/27/2022    8:53 AM 06/12/2022    1:26 PM  Vitals with BMI  Height  5\' 8"  5\' 8"   Weight  152 lbs 3 oz 147 lbs  BMI  06.26 94.85  Systolic 462 703 500   Diastolic 77 79 87  Pulse 58 60 91     Physical Exam Vitals reviewed.  HENT:     Head: Normocephalic and atraumatic.  Cardiovascular:     Rate and Rhythm: Normal rate and regular rhythm.     Pulses: Normal pulses.     Heart sounds: Murmur heard.  Pulmonary:     Effort: Pulmonary effort is normal.     Breath sounds: Normal breath sounds.  Abdominal:     General: Bowel sounds are normal.  Musculoskeletal:     Right lower leg: No edema.     Left lower leg: No edema.  Skin:    General: Skin is warm and dry.  Neurological:     Mental Status: She is alert.     Medications and allergies   Allergies  Allergen Reactions   Wheat     ciliac disease   Penicillins Rash    Waterblisters      Medication list after today's encounter   Current Outpatient Medications:    amLODipine (NORVASC) 5 MG tablet, Take 1 tablet (5 mg total) by mouth daily., Disp: 180 tablet, Rfl: 3   aspirin EC 81 MG tablet, Take 1 tablet (81  mg total) by mouth daily. (Patient taking differently: Take 81 mg by mouth 2 (two) times a week.), Disp: 90 tablet, Rfl: 3   losartan-hydrochlorothiazide (HYZAAR) 100-25 MG per tablet, Take 1 tablet by mouth daily., Disp: , Rfl:    metFORMIN (GLUCOPHAGE) 500 MG tablet, Take 500 mg by mouth daily., Disp: , Rfl:    metoprolol succinate (TOPROL-XL) 50 MG 24 hr tablet, Take 1 tablet (50 mg total) by mouth daily. Take with or immediately following a meal., Disp: 30 tablet, Rfl: 2   omeprazole (PRILOSEC) 40 MG capsule, Take 40 mg by mouth daily., Disp: , Rfl:    pravastatin (PRAVACHOL) 40 MG tablet, Take 40 mg by mouth daily. Every other day, Disp: , Rfl:    vitamin B-12 (CYANOCOBALAMIN) 100 MCG tablet, Take 100 mcg by mouth 2 (two) times a week., Disp: , Rfl:    Cholecalciferol (VITAMIN D-3) 125 MCG (5000 UT) TABS, Take 5,000 Units by mouth daily., Disp: , Rfl:    Polyethyl Glycol-Propyl Glycol (SYSTANE) 0.4-0.3 % SOLN, Place 1 drop into both eyes every morning., Disp: , Rfl:     potassium chloride SA (K-DUR) 20 MEQ tablet, TAKE 1/2 TABLET(10 MEQ) BY MOUTH DAILY (Patient not taking: Reported on 07/27/2022), Disp: 45 tablet, Rfl: 1   POTASSIUM PO, Take 1 tablet by mouth 2 (two) times a week., Disp: , Rfl:   Laboratory examination:   Lab Results  Component Value Date   NA 140 12/13/2021   K 3.8 12/13/2021   CO2 30 12/13/2021   GLUCOSE 132 (H) 12/13/2021   BUN 14 12/13/2021   CREATININE 0.78 12/13/2021   CALCIUM 9.4 12/13/2021   GFRNONAA >60 12/13/2021       Latest Ref Rng & Units 12/13/2021    9:00 AM 12/20/2020    9:01 AM 12/15/2019   11:11 AM  CMP  Glucose 70 - 99 mg/dL 573  220  254   BUN 8 - 23 mg/dL 14  13  15    Creatinine 0.44 - 1.00 mg/dL  2.70  6.23   Sodium 135 - 145 mmol/L 140  138  140   Potassium 3.5 - 5.1 mmol/L 3.8  3.3  3.9   Chloride 98 - 111 mmol/L 103  101  103   CO2 22 - 32 mmol/L 30  27  27    Calcium 8.9 - 10.3 mg/dL 9.4  9.4  9.1   Total Protein 6.5 - 8.1 g/dL 7.8  7.7  7.4   Total Bilirubin 0.3 - 1.2 mg/dL 0.6  0.8  0.5   Alkaline Phos 38 - 126 U/L 43  45  43   AST 15 - 41 U/L 35  43  29   ALT 0 - 44 U/L 55  70  43       Latest Ref Rng & Units 12/13/2021    9:00 AM 12/20/2020    9:01 AM 12/15/2019   11:11 AM  CBC  WBC 4.0 - 10.5 K/uL 4.8  5.7  6.0   Hemoglobin 12.0 - 15.0 g/dL 12/22/2020  12/17/2019  83.1   Hematocrit 36.0 - 46.0 % 42.2  42.8  41.6   Platelets 150 - 400 K/uL 237  244  245     Lipid Panel No results for input(s): "CHOL", "TRIG", "LDLCALC", "VLDL", "HDL", "CHOLHDL", "LDLDIRECT" in the last 8760 hours.  HEMOGLOBIN A1C No results found for: "HGBA1C", "MPG" TSH No results for input(s): "TSH" in the last 8760 hours.  External labs:  Radiology:    Cardiac Studies:   Echocardiogram 06/27/2022:  Normal LV systolic function with visual EF 60-65%. Left ventricle cavity  is normal in size. Normal left ventricular wall thickness. Normal global  wall motion. Doppler evidence of grade I (impaired) diastolic  dysfunction,  normal LAP. Calculated EF 72%.  Left atrial cavity is slightly dilated.  Structurally normal tricuspid valve.  Mild tricuspid regurgitation. No  evidence of pulmonary hypertension.      Lexiscan (with Bruce protocol) Nuclear stress test 07/04/2022: Myocardial perfusion is abnormal. There is a small sized reversible mild defect in the basal and mid inferolateral region.  Overall LV systolic function is normal without regional wall motion abnormalities. Stress LV EF: 70%.  Nondiagnostic ECG stress. Patient exercised on a Bruce protocol for 8 minutes and 58 seconds and achieved 8.23 METS and 78% of MPHR hence patient was injected with regadenoson.  Resting blood pressure was 180/100 mmHg, post regadenoson injection blood pressure was 169/98 mmHg.  Stress terminated due to fatigue. No previous exam available for comparison. Low risk.    EKG:   06/12/2022: Sinus Rhythm. Left atrial enlargement. Voltage criteria for LVH -Nonspecific ST depression possibly due to LVH   Assessment     ICD-10-CM   1. Mixed hyperlipidemia  E78.2     2. Essential hypertension  I10     3. Abnormal nuclear stress test  R94.39 CBC    Basic metabolic panel       Orders Placed This Encounter  Procedures   CBC   Basic metabolic panel    Meds ordered this encounter  Medications   amLODipine (NORVASC) 5 MG tablet    Sig: Take 1 tablet (5 mg total) by mouth daily.    Dispense:  180 tablet    Refill:  3   aspirin EC 81 MG tablet    Sig: Take 1 tablet (81 mg total) by mouth daily.    Dispense:  90 tablet    Refill:  3    Medications Discontinued During This Encounter  Medication Reason   amLODipine (NORVASC) 2.5 MG tablet      Recommendations:   ABAGALE BOULOS is a 77 y.o.  female with HTN and DM2  Abnormal nuclear stress test Given patient's symptoms and positive nuclear stress, will proceed with cardiac catheterization Schedule for cardiac catheterization, and possible  angioplasty. We discussed regarding risks, benefits, alternatives to this including stress testing, CTA and continued medical therapy. Patient wants to proceed. Understands <1-2% risk of death, stroke, MI, urgent CABG, bleeding, infection, renal failure but not limited to these. Patient instructed not to do heavy lifting, heavy exertional activity, swimming until evaluation is complete.  Patient instructed to call if symptoms worse or to go to the ED for further evaluation.   Essential hypertension Continue current cardiac medications, will increase amlodipine. Tolerating Toprol XL 50mg  qday without issue Encourage low-sodium diet, less than 2000 mg daily.   Mixed hyperlipidemia Continue pravastatin  Follow-up in 1 months or sooner if needed     Floydene Flock, DO, [facility]  07/27/2022, 2:21 PM Office: [phone] Pager: [phone]

## 2022-08-01 NOTE — Discharge Instructions (Signed)

## 2022-08-01 NOTE — Interval H&P Note (Signed)
History and Physical Interval Note:  08/01/2022 10:44 AM  Jessica Conley  has presented today for surgery, with the diagnosis of positive stress test, angina.  The various methods of treatment have been discussed with the patient and family. After consideration of risks, benefits and other options for treatment, the patient has consented to  Procedure(s): LEFT HEART CATH AND CORONARY ANGIOGRAPHY (N/A) as a surgical intervention.  The patient's history has been reviewed, patient examined, no change in status, stable for surgery.  I have reviewed the patient's chart and labs.  Questions were answered to the patient's satisfaction.    2016/2017 Appropriate Use Criteria for Coronary Revascularization Symptom Status: Ischemic Symptoms  Non-invasive Testing: Low risk  If no or indeterminate stress test, FFR/iFR results in all diseased vessels: N/A  Diabetes Mellitus: Yes  S/P CABG: No  Antianginal therapy (number of long-acting drugs): >=2  Patient undergoing renal transplant: No  Patient undergoing percutaneous valve procedure: No  1 Vessel Disease PCI CABG  No proximal LAD involvement, No proximal left dominant LCX involvement A (7); Indication 1 M (5); Indication 1  Proximal left dominant LCX involvement A (7); Indication 4 A (7); Indication 4  Proximal LAD involvement A (7); Indication 4 A (7); Indication 4  2 Vessel Disease  No proximal LAD involvement A (7); Indication 7 M (6); Indication 7  Proximal LAD involvement A (7); Indication 13 A (8); Indication 13  3 Vessel Disease  Low disease complexity (e.g., focal stenoses, SYNTAX <=22) A (7); Indication 18 A (8); Indication 18  Intermediate or high disease complexity (e.g., SYNTAX >=23) M (6); Indication 22 A (9); Indication 22  Left Main Disease  Isolated LMCA disease: ostial or midshaft A (7); Indication 24 A (9); Indication 24  Isolated LMCA disease: bifurcation involvement M (6); Indication 25 A (9); Indication 25  LMCA ostial or  midshaft, concurrent low disease burden multivessel disease (e.g., 1-2 additional focal stenoses, SYNTAX <=22) A (7); Indication 26 A (9); Indication 26  LMCA ostial or midshaft, concurrent intermediate or high disease burden multivessel disease (e.g., 1-2 additional bifurcation stenoses, long stenoses, SYNTAX >=23) M (4); Indication 27 A (9); Indication 27  LMCA bifurcation involvement, concurrent low disease burden multivessel disease (e.g., 1-2 additional focal stenoses, SYNTAX <=22) M (6); Indication 28 A (9); Indication 28  LMCA bifurcation involvement, concurrent intermediate or high disease burden multivessel disease (e.g., 1-2 additional bifurcation stenoses, long stenoses, SYNTAX >=23) R (3); Indication 29 A (9); Indication Jessica Conley

## 2022-08-02 ENCOUNTER — Encounter (HOSPITAL_COMMUNITY): Payer: Self-pay | Admitting: Cardiology

## 2022-08-02 ENCOUNTER — Telehealth: Payer: Self-pay

## 2022-08-02 NOTE — Telephone Encounter (Signed)
Ok we can cut her toprol in half. Please let her know thank you

## 2022-08-02 NOTE — Telephone Encounter (Signed)
PT aware

## 2022-08-02 NOTE — Telephone Encounter (Signed)
Patient has Cath yesterday with MP and he told her that her pulse is too low. Its in the low 50's, he said to look at her meds.

## 2022-08-08 ENCOUNTER — Encounter: Payer: Self-pay | Admitting: Hematology

## 2022-08-09 ENCOUNTER — Encounter: Payer: Self-pay | Admitting: Hematology

## 2022-08-15 ENCOUNTER — Encounter: Payer: Self-pay | Admitting: Internal Medicine

## 2022-08-15 ENCOUNTER — Ambulatory Visit: Payer: Medicare Other | Admitting: Internal Medicine

## 2022-08-15 VITALS — BP 136/74 | HR 62 | Ht 68.0 in | Wt 148.4 lb

## 2022-08-15 DIAGNOSIS — E119 Type 2 diabetes mellitus without complications: Secondary | ICD-10-CM

## 2022-08-15 DIAGNOSIS — E782 Mixed hyperlipidemia: Secondary | ICD-10-CM

## 2022-08-15 DIAGNOSIS — I10 Essential (primary) hypertension: Secondary | ICD-10-CM

## 2022-08-15 NOTE — Progress Notes (Signed)
Primary Physician/Referring:  [name], DO  Patient ID: Jessica Conley, female    DOB: [birth year], 77 y.o.   MRN: [id number]  Chief Complaint  Patient presents with   Chest Pain   mixed hyperlipidemia   Hospitalization Follow-up    HPI:    Jessica Conley  is a 77 y.o. female with HTN, HLD, and DM2 who is here for a follow-up visit. She underwent diagnostic left heart catheterization about 2 weeks and no obstructive CAD was found. Her symptoms seem to be more GI related. Patient is quite relieved that her heart and coronary arteries are without significant disease. She is going to see a GI doctor next to hopefully find out what is causing her heartburn. Denies claudication, shortness of breath, palpitations, diaphoresis, syncope, edema, PND, orthopnea.   Past Medical History:  Diagnosis Date   Celiac sprue    Hemochromatosis, hereditary ([place]) 05/18/2010   Hypertension    Past Surgical History:  Procedure Laterality Date   EYE SURGERY     LEFT HEART CATH AND CORONARY ANGIOGRAPHY N/A 08/01/2022   Procedure: LEFT HEART CATH AND CORONARY ANGIOGRAPHY;  Surgeon: Nigel Mormon, MD;  Location: [facility];  Service: Cardiovascular;  Laterality: N/A;   PHLEBOTOMY THERAPEUTIC  08/21/2011       VAGINAL HYSTERECTOMY  early age 49s   prolapse   Family History  Problem Relation Age of Onset   Heart attack Mother    Melanoma Father    Breast cancer Daughter 38    Social History   Tobacco Use   Smoking status: Never   Smokeless tobacco: Never  Substance Use Topics   Alcohol use: Yes    Alcohol/week: 4.0 standard drinks of alcohol    Types: 4 Glasses of wine per week    Comment: Wine   Marital Status: Married  ROS  Review of Systems  Cardiovascular:  Negative for chest pain.  Gastrointestinal:  Positive for heartburn.   Objective  Blood pressure 136/74, pulse 62, height 5\' 8"  (1.727 m), weight 148 lb 6.4 oz (67.3 kg), SpO2 98 %. Body mass index is  22.56 kg/m.     08/15/2022    8:25 AM 08/01/2022    2:41 PM 08/01/2022    2:00 PM  Vitals with BMI  Height 5\' 8"     Weight 148 lbs 6 oz    BMI 41.93    Systolic 790 240 973  Diastolic 74 76 59  Pulse 62 49 45     Physical Exam Vitals reviewed.  HENT:     Head: Normocephalic and atraumatic.  Cardiovascular:     Rate and Rhythm: Normal rate and regular rhythm.     Pulses: Normal pulses.     Heart sounds: Murmur heard.  Pulmonary:     Effort: Pulmonary effort is normal.     Breath sounds: Normal breath sounds.  Abdominal:     General: Bowel sounds are normal.  Musculoskeletal:     Right lower leg: No edema.     Left lower leg: No edema.  Skin:    General: Skin is warm and dry.  Neurological:     Mental Status: She is alert.     Medications and allergies   Allergies  Allergen Reactions   Wheat     ciliac disease   Penicillins Rash    Waterblisters      Medication list after today's encounter   Current Outpatient Medications:    amLODipine (NORVASC) 5 MG  tablet, Take 1 tablet (5 mg total) by mouth daily., Disp: 180 tablet, Rfl: 3   Cholecalciferol (VITAMIN D-3) 125 MCG (5000 UT) TABS, Take 5,000 Units by mouth daily., Disp: , Rfl:    losartan-hydrochlorothiazide (HYZAAR) 100-25 MG per tablet, Take 1 tablet by mouth daily., Disp: , Rfl:    metFORMIN (GLUCOPHAGE) 500 MG tablet, Take 500 mg by mouth daily., Disp: , Rfl:    metoprolol succinate (TOPROL-XL) 50 MG 24 hr tablet, Take 1 tablet (50 mg total) by mouth daily. Take with or immediately following a meal. (Patient taking differently: Take 25 mg by mouth daily. Take with or immediately following a meal.), Disp: 30 tablet, Rfl: 2   omeprazole (PRILOSEC) 40 MG capsule, Take 40 mg by mouth daily., Disp: , Rfl:    Polyethyl Glycol-Propyl Glycol (SYSTANE) 0.4-0.3 % SOLN, Place 1 drop into both eyes every morning., Disp: , Rfl:    Potassium 99 MG TABS, Take 1 tablet by mouth daily., Disp: , Rfl:    pravastatin (PRAVACHOL)  40 MG tablet, Take 40 mg by mouth daily. Every other day, Disp: , Rfl:    vitamin B-12 (CYANOCOBALAMIN) 100 MCG tablet, Take 100 mcg by mouth 2 (two) times a week., Disp: , Rfl:   Laboratory examination:   Lab Results  Component Value Date   NA 134 07/27/2022   K 3.9 07/27/2022   CO2 22 07/27/2022   GLUCOSE 124 (H) 07/27/2022   BUN 13 07/27/2022   CREATININE 0.68 07/27/2022   CALCIUM 9.3 07/27/2022   EGFR 89 07/27/2022   GFRNONAA >60 12/13/2021       Latest Ref Rng & Units 07/27/2022    9:48 AM 12/13/2021    9:00 AM 12/20/2020    9:01 AM  CMP  Glucose 70 - 99 mg/dL 010  932  355   BUN 8 - 27 mg/dL 13  14  13    Creatinine 0.57 - 1.00 mg/dL  7.32  2.02   Sodium 134 - 144 mmol/L 134  140  138   Potassium 3.5 - 5.2 mmol/L 3.9  3.8  3.3   Chloride 96 - 106 mmol/L 95  103  101   CO2 20 - 29 mmol/L 22  30  27    Calcium 8.7 - 10.3 mg/dL 9.3  9.4  9.4   Total Protein 6.5 - 8.1 g/dL  7.8  7.7   Total Bilirubin 0.3 - 1.2 mg/dL  0.6  0.8   Alkaline Phos 38 - 126 U/L  43  45   AST 15 - 41 U/L  35  43   ALT 0 - 44 U/L  55  70       Latest Ref Rng & Units 07/27/2022    9:48 AM 12/13/2021    9:00 AM 12/20/2020    9:01 AM  CBC  WBC 3.4 - 10.8 x10E3/uL 5.9  4.8  5.7   Hemoglobin 11.1 - 15.9 g/dL 12/15/2021  12/22/2020  70.6   Hematocrit 34.0 - 46.6 % 40.7  42.2  42.8   Platelets 150 - 450 x10E3/uL 234  237  244     Lipid Panel No results for input(s): "CHOL", "TRIG", "LDLCALC", "VLDL", "HDL", "CHOLHDL", "LDLDIRECT" in the last 8760 hours.  HEMOGLOBIN A1C No results found for: "HGBA1C", "MPG" TSH No results for input(s): "TSH" in the last 8760 hours.  External labs:     Radiology:    Cardiac Studies:   Echocardiogram 06/27/2022:  Normal LV systolic function with visual EF  60-65%. Left ventricle cavity  is normal in size. Normal left ventricular wall thickness. Normal global  wall motion. Doppler evidence of grade I (impaired) diastolic dysfunction,  normal LAP. Calculated EF  72%.  Left atrial cavity is slightly dilated.  Structurally normal tricuspid valve.  Mild tricuspid regurgitation. No  evidence of pulmonary hypertension.      Lexiscan (with Bruce protocol) Nuclear stress test 07/04/2022: Myocardial perfusion is abnormal. There is a small sized reversible mild defect in the basal and mid inferolateral region.  Overall LV systolic function is normal without regional wall motion abnormalities. Stress LV EF: 70%.  Nondiagnostic ECG stress. Patient exercised on a Bruce protocol for 8 minutes and 58 seconds and achieved 8.23 METS and 78% of MPHR hence patient was injected with regadenoson.  Resting blood pressure was 180/100 mmHg, post regadenoson injection blood pressure was 169/98 mmHg.  Stress terminated due to fatigue. No previous exam available for comparison. Low risk.    LHC 08/01/2022: Normal coronary arteries without significant disease Consider alternate etiology for patients chest heaviness/burning sensation   EKG:   06/12/2022: Sinus Rhythm. Left atrial enlargement. Voltage criteria for LVH -Nonspecific ST depression possibly due to LVH   Assessment     ICD-10-CM   1. Essential hypertension  I10     2. Mixed hyperlipidemia  E78.2     3. Type 2 diabetes mellitus without complication, without long-term current use of insulin (HCC)  E11.9        No orders of the defined types were placed in this encounter.   No orders of the defined types were placed in this encounter.   Medications Discontinued During This Encounter  Medication Reason   aspirin EC 81 MG tablet Completed Course   potassium chloride SA (K-DUR) 20 MEQ tablet Dose change   POTASSIUM PO Duplicate     Recommendations:   Jessica Conley is a 77 y.o.  female with HTN and DM2  Diabetes Continue current medications Managed by primary   Essential hypertension Continue current cardiac medications. BP very well controlled on current regiment Encourage  low-sodium diet, less than 2000 mg daily.   Mixed hyperlipidemia Continue pravastatin Managed by primary  Follow-up in 6 months or sooner if needed     Floydene Flock, DO, [facility]  08/16/2022, 12:27 PM Office: [phone] Pager: [phone]

## 2022-09-16 ENCOUNTER — Other Ambulatory Visit: Payer: Self-pay | Admitting: Internal Medicine

## 2022-10-16 DIAGNOSIS — I10 Essential (primary) hypertension: Secondary | ICD-10-CM | POA: Diagnosis not present

## 2022-10-16 DIAGNOSIS — K760 Fatty (change of) liver, not elsewhere classified: Secondary | ICD-10-CM | POA: Diagnosis not present

## 2022-10-16 DIAGNOSIS — E7800 Pure hypercholesterolemia, unspecified: Secondary | ICD-10-CM | POA: Diagnosis not present

## 2022-10-16 DIAGNOSIS — E118 Type 2 diabetes mellitus with unspecified complications: Secondary | ICD-10-CM | POA: Diagnosis not present

## 2022-10-31 DIAGNOSIS — H26493 Other secondary cataract, bilateral: Secondary | ICD-10-CM | POA: Diagnosis not present

## 2022-10-31 DIAGNOSIS — H524 Presbyopia: Secondary | ICD-10-CM | POA: Diagnosis not present

## 2022-10-31 DIAGNOSIS — H04123 Dry eye syndrome of bilateral lacrimal glands: Secondary | ICD-10-CM | POA: Diagnosis not present

## 2022-10-31 DIAGNOSIS — Z961 Presence of intraocular lens: Secondary | ICD-10-CM | POA: Diagnosis not present

## 2022-10-31 DIAGNOSIS — H40013 Open angle with borderline findings, low risk, bilateral: Secondary | ICD-10-CM | POA: Diagnosis not present

## 2022-11-08 ENCOUNTER — Telehealth: Payer: Self-pay

## 2022-11-08 DIAGNOSIS — E118 Type 2 diabetes mellitus with unspecified complications: Secondary | ICD-10-CM | POA: Diagnosis not present

## 2022-11-08 DIAGNOSIS — Z0000 Encounter for general adult medical examination without abnormal findings: Secondary | ICD-10-CM | POA: Diagnosis not present

## 2022-11-08 NOTE — Patient Outreach (Signed)
  Care Coordination   In Person Provider Office Visit Note   11/08/2022 Name: Jessica Conley MRN: [id number] DOB: [birth year]  Jessica Conley is a 77 y.o. year old female who sees Irena Reichmann, [place] for primary care. I engaged with Jessica Conley in the providers office today.  What matters to the patients health and wellness today?  none    Goals Addressed             This Visit's Progress    COMPLETED: Care Coordination Activities-No follow up required       Care Coordination Interventions: Advised patient to Annual Wellness exam. Discussed [facility] services and support. Assessed SDOH. Advised to discuss with primary care physician if services needed in the future.        SDOH assessments and interventions completed:  Yes  SDOH Interventions Today    Flowsheet Row Most Recent Value  SDOH Interventions   Housing Interventions Intervention Not Indicated  Transportation Interventions Intervention Not Indicated        Care Coordination Interventions:  Yes, provided   Follow up plan: No further intervention required.   Encounter Outcome:  Pt. Visit Completed   Jessica Leriche, RN, MSN [facility] Care Management Care Management Coordinator Direct Line [phone]

## 2022-11-08 NOTE — Patient Instructions (Signed)
Visit Information  Thank you for taking time to visit with me today. Please don't hesitate to contact me if I can be of assistance to you.   Following are the goals we discussed today:   Goals Addressed             This Visit's Progress    COMPLETED: Care Coordination Activities-No follow up required       Care Coordination Interventions: Advised patient to Annual Wellness exam. Discussed [facility] services and support. Assessed SDOH. Advised to discuss with primary care physician if services needed in the future.         If you are experiencing a Mental Health or Behavioral Health Crisis or need someone to talk to, please call the Suicide and Crisis Lifeline: 988   Patient verbalizes understanding of instructions and care plan provided today and agrees to view in MyChart. Active MyChart status and patient understanding of how to access instructions and care plan via MyChart confirmed with patient.     The patient has been provided with contact information for the care management team and has been advised to call with any health related questions or concerns.   Baila Rouse J Sanii Kukla, RN, MSN [facility] Care Management Care Management Coordinator Direct Line [phone]     

## 2022-11-16 DIAGNOSIS — K080 Exfoliation of teeth due to systemic causes: Secondary | ICD-10-CM | POA: Diagnosis not present

## 2022-12-12 ENCOUNTER — Telehealth: Payer: Self-pay | Admitting: Hematology

## 2022-12-14 ENCOUNTER — Inpatient Hospital Stay: Payer: Medicare Other | Attending: Hematology

## 2022-12-17 ENCOUNTER — Other Ambulatory Visit: Payer: Self-pay | Admitting: Internal Medicine

## 2022-12-20 ENCOUNTER — Other Ambulatory Visit: Payer: Self-pay | Admitting: Nurse Practitioner

## 2022-12-20 NOTE — Progress Notes (Deleted)
Patient Care Team: Irena Reichmann, DO as PCP - General (Family Medicine)   CHIEF COMPLAINT: Follow-up hemochromatosis  Oncology History   No history exists.     CURRENT THERAPY: Partial phlebotomy for ferritin > than 100 (300 mL blood removed with NS 500 mL infusion)  INTERVAL HISTORY Jessica Conley returns for follow-up as scheduled, last seen by Dr. Mosetta Putt 12/21/2021.  ROS   Past Medical History:  Diagnosis Date   Celiac sprue    Hemochromatosis, hereditary (HCC) 05/18/2010   Hypertension      Past Surgical History:  Procedure Laterality Date   EYE SURGERY     LEFT HEART CATH AND CORONARY ANGIOGRAPHY N/A 08/01/2022   Procedure: LEFT HEART CATH AND CORONARY ANGIOGRAPHY;  Surgeon: Elder Negus, MD;  Location: [facility];  Service: Cardiovascular;  Laterality: N/A;   PHLEBOTOMY THERAPEUTIC  08/21/2011       VAGINAL HYSTERECTOMY  early age 30s   prolapse     Outpatient Encounter Medications as of 12/21/2022  Medication Sig   amLODipine (NORVASC) 5 MG tablet Take 1 tablet (5 mg total) by mouth daily.   Cholecalciferol (VITAMIN D-3) 125 MCG (5000 UT) TABS Take 5,000 Units by mouth daily.   losartan-hydrochlorothiazide (HYZAAR) 100-25 MG per tablet Take 1 tablet by mouth daily.   metFORMIN (GLUCOPHAGE) 500 MG tablet Take 500 mg by mouth daily.   metoprolol succinate (TOPROL-XL) 50 MG 24 hr tablet TAKE 1 TABLET(50 MG) BY MOUTH DAILY WITH OR IMMEDIATELY FOLLOWING A MEAL   omeprazole (PRILOSEC) 40 MG capsule Take 40 mg by mouth daily.   Polyethyl Glycol-Propyl Glycol (SYSTANE) 0.4-0.3 % SOLN Place 1 drop into both eyes every morning.   Potassium 99 MG TABS Take 1 tablet by mouth daily.   pravastatin (PRAVACHOL) 40 MG tablet Take 40 mg by mouth daily. Every other day   vitamin B-12 (CYANOCOBALAMIN) 100 MCG tablet Take 100 mcg by mouth 2 (two) times a week.   No facility-administered encounter medications on file as of 12/21/2022.     There were no vitals filed for  this visit. There is no height or weight on file to calculate BMI.   PHYSICAL EXAM GENERAL:alert, no distress and comfortable SKIN: no rash  EYES: sclera clear NECK: without mass LYMPH:  no palpable cervical or supraclavicular lymphadenopathy  LUNGS: clear with normal breathing effort HEART: regular rate & rhythm, no lower extremity edema ABDOMEN: abdomen soft, non-tender and normal bowel sounds NEURO: alert & oriented x 3 with fluent speech, no focal motor/sensory deficits Breast exam:  PAC without erythema    CBC    Component Value Date/Time   WBC 5.9 07/27/2022 0948   WBC 4.8 12/13/2021 0900   WBC 4.9 02/01/2017 0811   RBC 4.15 07/27/2022 0948   RBC 4.28 12/13/2021 0900   HGB 14.2 07/27/2022 0948   HGB 14.7 09/24/2013 0903   HCT 40.7 07/27/2022 0948   HCT 42.7 09/24/2013 0903   PLT 234 07/27/2022 0948   MCV 98 (H) 07/27/2022 0948   MCV 99.7 09/24/2013 0903   MCH 34.2 (H) 07/27/2022 0948   MCH 34.1 (H) 12/13/2021 0900   MCHC 34.9 07/27/2022 0948   MCHC 34.6 12/13/2021 0900   RDW 11.7 07/27/2022 0948   RDW 12.7 09/24/2013 0903   LYMPHSABS 1.7 12/13/2021 0900   LYMPHSABS 1.9 08/12/2018 0914   LYMPHSABS 2.0 09/24/2013 0903   MONOABS 0.5 12/13/2021 0900   MONOABS 0.7 09/24/2013 0903   EOSABS 0.1 12/13/2021 0900  EOSABS 0.1 08/12/2018 0914   BASOSABS 0.0 12/13/2021 0900   BASOSABS 0.0 08/12/2018 0914   BASOSABS 0.0 09/24/2013 0903     CMP     Component Value Date/Time   NA 134 07/27/2022 0948   NA 140 09/24/2013 0903   K 3.9 07/27/2022 0948   K 4.7 09/24/2013 0903   CL 95 (L) 07/27/2022 0948   CO2 22 07/27/2022 0948   CO2 30 (H) 09/24/2013 0903   GLUCOSE 124 (H) 07/27/2022 0948   GLUCOSE 132 (H) 12/13/2021 0900   GLUCOSE 117 09/24/2013 0903   BUN 13 07/27/2022 0948   BUN 17.0 09/24/2013 0903   CREATININE 0.68 07/27/2022 0948   CREATININE 0.78 12/13/2021 0900   CREATININE 0.71 10/12/2014 0946   CREATININE 0.9 09/24/2013 0903   CALCIUM 9.3 07/27/2022  0948   CALCIUM 9.9 09/24/2013 0903   PROT 7.8 12/13/2021 0900   PROT 7.2 03/07/2016 1039   PROT 7.9 09/24/2013 0903   ALBUMIN 4.6 12/13/2021 0900   ALBUMIN 4.5 03/07/2016 1039   ALBUMIN 4.4 09/24/2013 0903   AST 35 12/13/2021 0900   AST 35 (H) 09/24/2013 0903   ALT 55 (H) 12/13/2021 0900   ALT 58 (H) 09/24/2013 0903   ALKPHOS 43 12/13/2021 0900   ALKPHOS 46 09/24/2013 0903   BILITOT 0.6 12/13/2021 0900   BILITOT 0.54 09/24/2013 0903   GFRNONAA >60 12/13/2021 0900   GFRAA >60 12/15/2019 1111     ASSESSMENT & PLAN:Jessica Conley is a 77 y.o. female with    1. Hereditary hemochromatosis, homozygote C282Y -Diagnosed in 09/2010. Peak ferritin 461 at time of diagnosis. She had a liver biopsy on 09/09/10: 4+ iron; mild periportal inflammation consistent with underlying steatosis.  -Dr Frederich Chick restarted her on phlebotomies since age 33. She had poor tolerance and had a brief syncopal episode. She stabilized quickly with saline administration. Dr. Reece Agar recommended to keep her ferritin below 150.  -Will continue phlebotomy treatment with goal of Ferritin below 100 due to her age and poor tolerance (typical goal is 50). Will give IV Fluids with each phlebotomy  -Will check her liver with [place] every 2 years to monitor for iron overload which can lead to liver cirrhosis and other complications.  Most recent ultrasound 12/13/2021 showed increased echogenicity of the liver without focal lesion ***   2. HTN, Borderline DM  -On Hyzaar, Metformin, oral potassium  -Continues HTN medication and f/u with PCP.    3. Cancer Screening  -Continue age appropriate cancer screenings  -She will continue to yearly mammograms -By her next coloscopy she will be 78, GI did not recommend she continue screening.     PLAN:  No orders of the defined types were placed in this encounter.     All questions were answered. The patient knows to call the clinic with any problems, questions or concerns. No  barriers to learning were detected. I spent *** counseling the patient face to face. The total time spent in the appointment was *** and more than 50% was on counseling, review of test results, and coordination of care.   Jessica Glad, NP-C @DATE @

## 2022-12-21 ENCOUNTER — Inpatient Hospital Stay: Payer: Medicare Other | Admitting: Nurse Practitioner

## 2023-01-16 ENCOUNTER — Telehealth: Payer: Self-pay | Admitting: Nurse Practitioner

## 2023-01-16 NOTE — Telephone Encounter (Signed)
Contacted patient to scheduled appointments. Left message with appointment details and a call back number if patient had any questions or could not accommodate the time we provided.   

## 2023-01-23 ENCOUNTER — Other Ambulatory Visit: Payer: Self-pay | Admitting: Internal Medicine

## 2023-02-05 ENCOUNTER — Other Ambulatory Visit: Payer: Medicare Other

## 2023-02-08 ENCOUNTER — Ambulatory Visit: Payer: Medicare Other | Admitting: Nurse Practitioner

## 2023-02-13 ENCOUNTER — Ambulatory Visit: Payer: Medicare Other | Admitting: Internal Medicine

## 2023-02-14 ENCOUNTER — Ambulatory Visit: Payer: Medicare Other | Admitting: Cardiology

## 2023-02-16 ENCOUNTER — Encounter: Payer: Self-pay | Admitting: Cardiology

## 2023-02-16 ENCOUNTER — Ambulatory Visit: Payer: Medicare Other | Admitting: Cardiology

## 2023-02-16 VITALS — BP 146/75 | HR 57 | Resp 16 | Ht 68.0 in | Wt 154.8 lb

## 2023-02-16 DIAGNOSIS — E782 Mixed hyperlipidemia: Secondary | ICD-10-CM | POA: Diagnosis not present

## 2023-02-16 DIAGNOSIS — I10 Essential (primary) hypertension: Secondary | ICD-10-CM | POA: Diagnosis not present

## 2023-02-16 NOTE — Progress Notes (Signed)
Follow up visit  Subjective:   Jessica Conley, female    DOB: 1946/02/01, 77 y.o.   MRN: [id number]   HPI  Chief Complaint  Patient presents with   Chest Pain    77 y.o. Caucasian female with hypertension, hyperlipidemia, type 2 diabetes mellitus  Patient underwent coronary angiography in 07/2022 due to retrosternal chest burning sensation and abnormal stress test. There was no significant CAD noted. Coincidentally, her chest burning sensation has completely resolved since the angiogram.  Blood pressure is elevated today, but she attributes it to just having had her lunch. At home, blood pressure is well controlled.   I do not have any recent lipid panel results.     Current Outpatient Medications:    amLODipine (NORVASC) 5 MG tablet, Take 1 tablet (5 mg total) by mouth daily., Disp: 180 tablet, Rfl: 3   Cholecalciferol (VITAMIN D-3) 125 MCG (5000 UT) TABS, Take 5,000 Units by mouth daily., Disp: , Rfl:    losartan-hydrochlorothiazide (HYZAAR) 100-25 MG per tablet, Take 1 tablet by mouth daily., Disp: , Rfl:    metFORMIN (GLUCOPHAGE) 500 MG tablet, Take 500 mg by mouth daily., Disp: , Rfl:    metoprolol succinate (TOPROL-XL) 50 MG 24 hr tablet, TAKE 1 TABLET(50 MG) BY MOUTH DAILY WITH OR IMMEDIATELY FOLLOWING A MEAL (Patient taking differently: Take 25 mg by mouth daily.), Disp: 30 tablet, Rfl: 2   omeprazole (PRILOSEC) 40 MG capsule, Take 40 mg by mouth daily., Disp: , Rfl:    Polyethyl Glycol-Propyl Glycol (SYSTANE) 0.4-0.3 % SOLN, Place 1 drop into both eyes every morning., Disp: , Rfl:    Potassium 99 MG TABS, Take 1 tablet by mouth daily., Disp: , Rfl:    pravastatin (PRAVACHOL) 40 MG tablet, Take 40 mg by mouth daily. Every other day, Disp: , Rfl:    vitamin B-12 (CYANOCOBALAMIN) 100 MCG tablet, Take 100 mcg by mouth 2 (two) times a week., Disp: , Rfl:    Cardiovascular & other pertient studies:  Reviewed external labs and tests, independently interpreted  EKG  02/16/2023: Sinus rhythm 57 bpm First degree AV block Incomplete RBBB  Coronary angiogram 08/01/2022: Normal coronary arteries without significant disease Consider alternate etiology for patients chest heaviness/burning sensation   Echocardiogram 06/27/2022: Normal LV systolic function with visual EF 60-65%. Left ventricle cavity is normal in size. Normal left ventricular wall thickness. Normal global wall motion. Doppler evidence of grade I (impaired) diastolic dysfunction, normal LAP. Calculated EF 72%. Left atrial cavity is slightly dilated. Structurally normal tricuspid valve.  Mild tricuspid regurgitation. No evidence of pulmonary hypertension.  Lexiscan (with Bruce protocol) Nuclear stress test 07/04/2022: Myocardial perfusion is abnormal. There is a small sized reversible mild defect in the basal and mid inferolateral region.  Overall LV systolic function is normal without regional wall motion abnormalities. Stress LV EF: 70%.  Nondiagnostic ECG stress. Patient exercised on a Bruce protocol for 8 minutes and 58 seconds and achieved 8.23 METS and 78% of MPHR hence patient was injected with regadenoson.  Resting blood pressure was 180/100 mmHg, post regadenoson injection blood pressure was 169/98 mmHg.  Stress terminated due to fatigue. No previous exam available for comparison. Low risk.   Recent labs: 07/27/2022: Glucose 124, BUN/Cr 13/0.68. EGFR 89. Na/K 134/3.9. ALT 55. Rest of the CMP normal H/H 14/40. MCV 98. Platelets 234 HbA1C 6.4%  2022: Chol 217, TG 73, HDL 61, LDL 128  2021: TSH 1.2 normal    Review of Systems  Cardiovascular:  Negative for  chest pain, dyspnea on exertion, leg swelling, palpitations and syncope.         Vitals:   02/16/23 1338  BP: (!) 146/75  Pulse: (!) 57  Resp: 16  SpO2: 96%    Body mass index is 23.54 kg/m. Filed Weights   02/16/23 1338  Weight: 154 lb 12.8 oz (70.2 kg)     Objective:   Physical Exam Vitals and nursing  note reviewed.  Constitutional:      General: She is not in acute distress. Neck:     Vascular: No JVD.  Cardiovascular:     Rate and Rhythm: Normal rate and regular rhythm.     Heart sounds: Normal heart sounds. No murmur heard. Pulmonary:     Effort: Pulmonary effort is normal.     Breath sounds: Normal breath sounds. No wheezing or rales.  Musculoskeletal:     Right lower leg: No edema.     Left lower leg: No edema.         Visit diagnoses:   ICD-10-CM   1. Essential hypertension  I10     2. Mixed hyperlipidemia  E78.2        No orders of the defined types were placed in this encounter.   Assessment & Recommendations:   77 y.o. Caucasian female with hypertension, hyperlipidemia, type 2 diabetes mellitus  Hypertension: Generally controlled at home. Continue home monitoring. F/u w/PCP.  Hyperlipidemia: Recent labs not available for my review. If LDL >100, could consider changing pravastatin to rosuvastatin 20-40 mg daily.   Chest burning sensation completely resolved. No significant CAD on coronary angiogram.  F/u as needed.    Elder Negus, MD Pager: [phone] Office: [phone]

## 2023-04-20 DIAGNOSIS — Z23 Encounter for immunization: Secondary | ICD-10-CM | POA: Diagnosis not present

## 2023-05-03 DIAGNOSIS — E118 Type 2 diabetes mellitus with unspecified complications: Secondary | ICD-10-CM | POA: Diagnosis not present

## 2023-05-03 DIAGNOSIS — E7800 Pure hypercholesterolemia, unspecified: Secondary | ICD-10-CM | POA: Diagnosis not present

## 2023-05-03 DIAGNOSIS — E559 Vitamin D deficiency, unspecified: Secondary | ICD-10-CM | POA: Diagnosis not present

## 2023-05-10 DIAGNOSIS — E7800 Pure hypercholesterolemia, unspecified: Secondary | ICD-10-CM | POA: Diagnosis not present

## 2023-05-10 DIAGNOSIS — E118 Type 2 diabetes mellitus with unspecified complications: Secondary | ICD-10-CM | POA: Diagnosis not present

## 2023-05-10 DIAGNOSIS — K219 Gastro-esophageal reflux disease without esophagitis: Secondary | ICD-10-CM | POA: Diagnosis not present

## 2023-05-15 DIAGNOSIS — Z1231 Encounter for screening mammogram for malignant neoplasm of breast: Secondary | ICD-10-CM | POA: Diagnosis not present

## 2023-05-17 IMAGING — US US ABDOMEN COMPLETE
1 series · 14 of 25 positions shown · non-contrast
Comparison: Abdominal ultrasound 12/24/2019

CLINICAL DATA: Hemochromatosis

EXAM:
ABDOMEN ULTRASOUND COMPLETE

[Series 1: us abdomen complete · 14 of 101 slices shown]
[im 1/101]
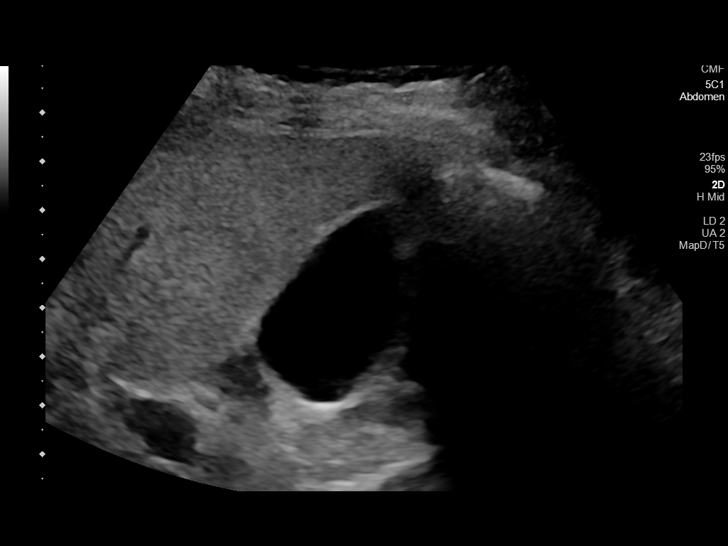
[im 9/101]
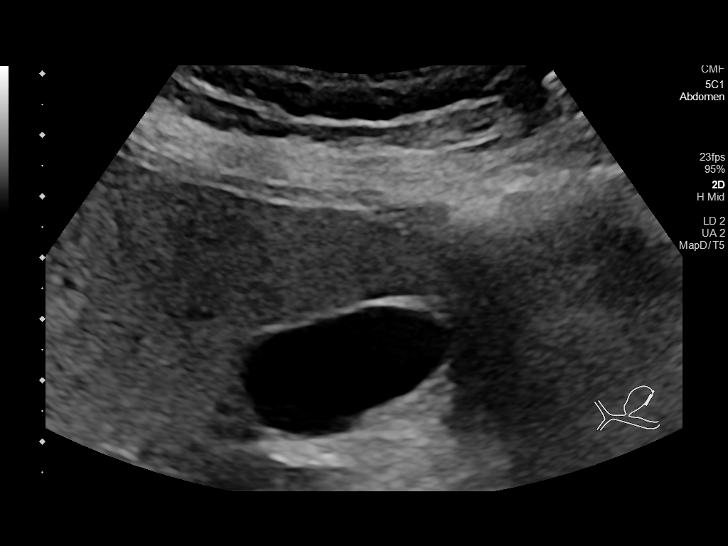
[im 17/101]
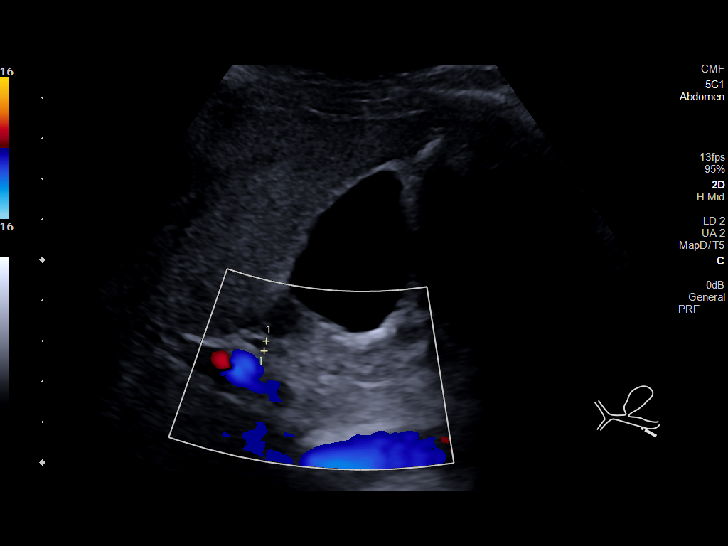
[im 26/101]
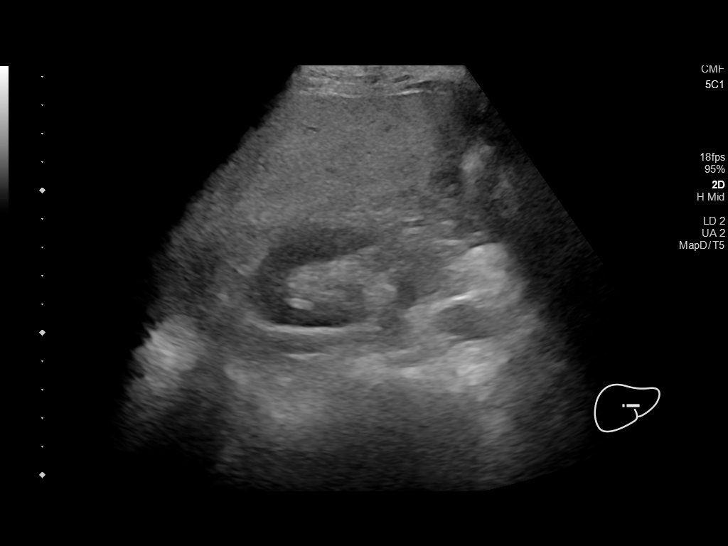
[im 34/101]
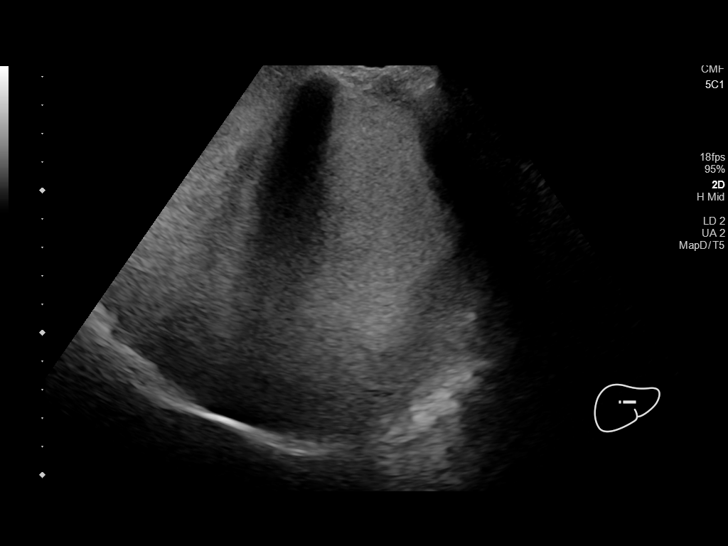
[im 38/101]
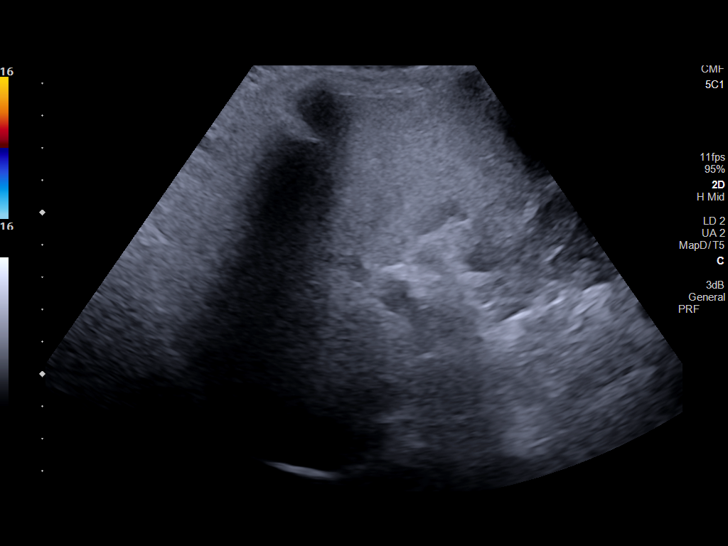
[im 46/101]
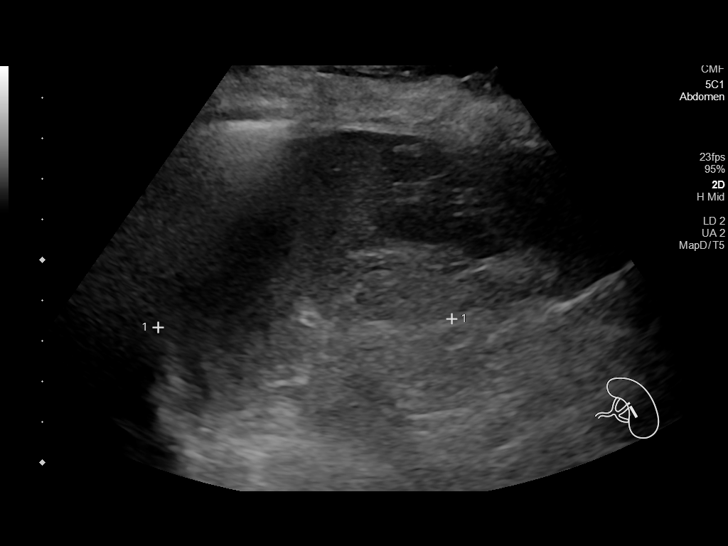
[im 55/101]
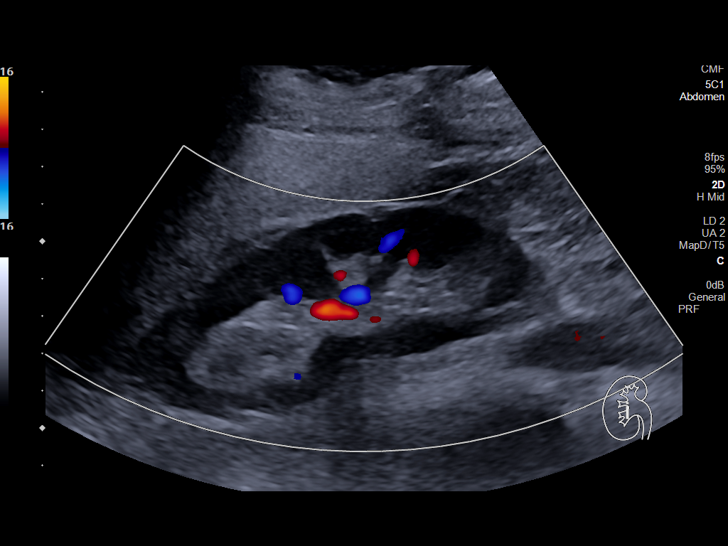
[im 63/101]
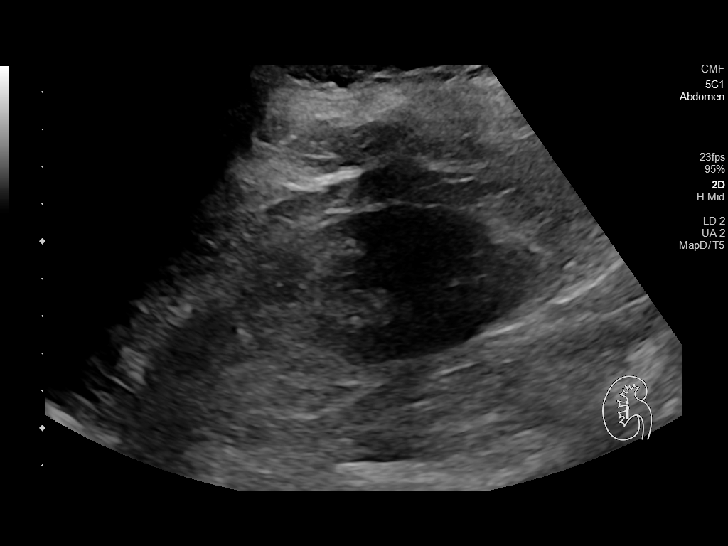
[im 67/101]
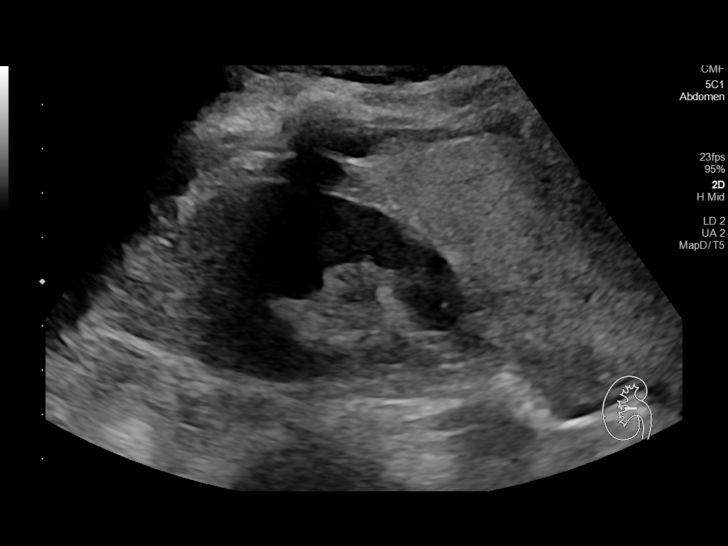
[im 76/101]
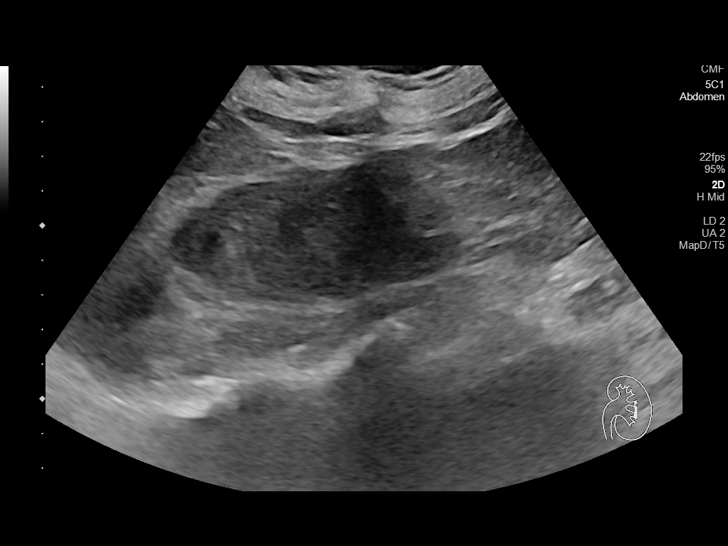
[im 84/101]
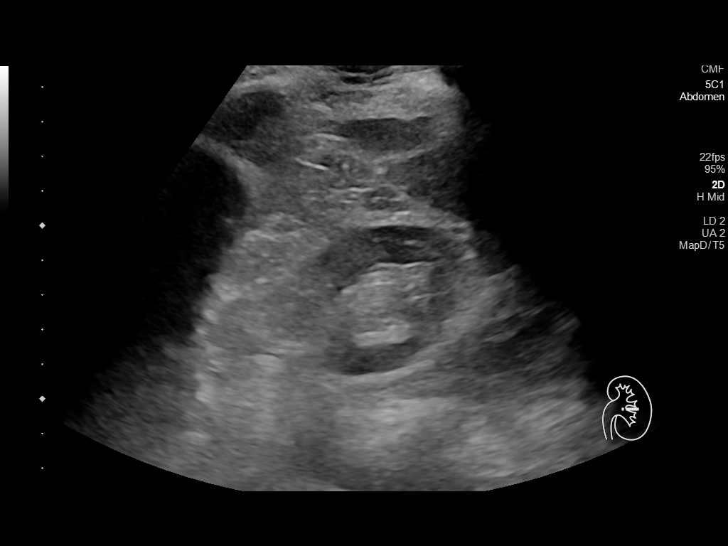
[im 92/101]
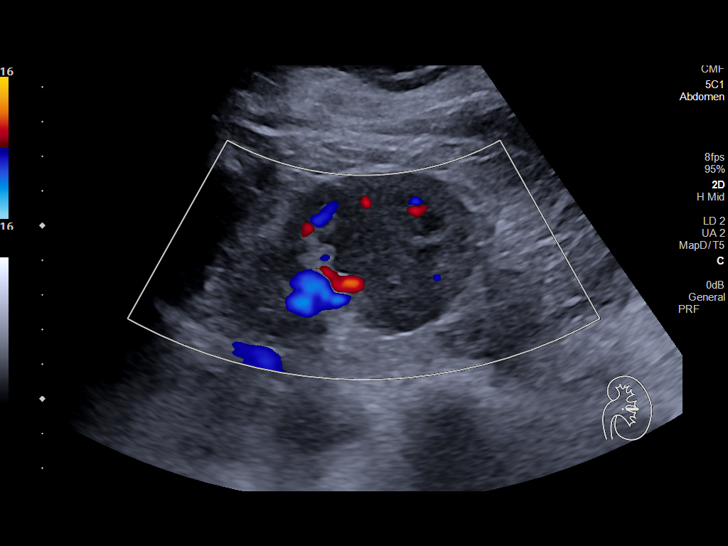
[im 101/101]
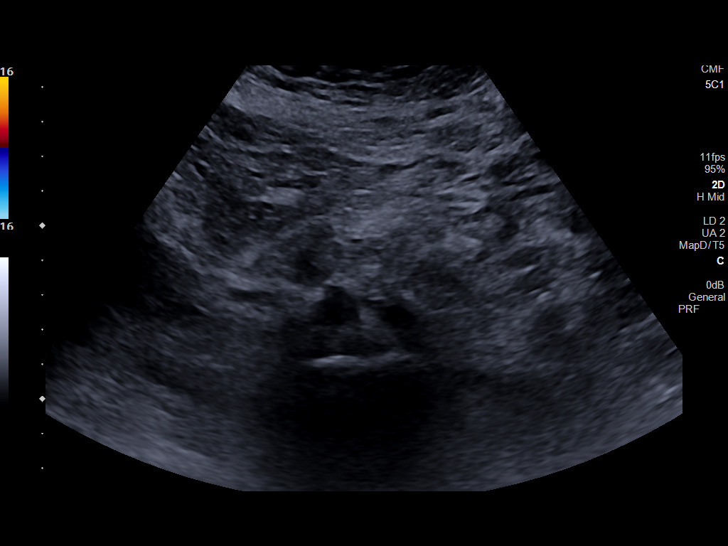

[14 of 25 positions shown; findings below may reference images not displayed]

FINDINGS: Gallbladder: No gallstones or wall thickening visualized. No
sonographic Murphy sign noted by sonographer.

Common bile duct: Diameter: 3 mm

Liver: Diffuse increased echogenicity of the parenchyma with no
focal mass identified. Portal vein is patent on color Doppler
imaging with normal direction of blood flow towards the liver.

IVC: No abnormality visualized.

Pancreas: Visualized portion unremarkable.

Spleen: Size and appearance within normal limits.

Right Kidney: Length: 11.1 cm. Echogenicity within normal limits. No
mass or hydronephrosis visualized.

Left Kidney: Length: 12.3 cm. Echogenicity within normal limits. No
mass or hydronephrosis visualized.

Abdominal aorta: No aneurysm visualized.

Other findings: None.
IMPRESSION: Diffuse increased echogenicity of the liver parenchyma which may
represent hepatic steatosis and/or other hepatocellular disease.

## 2023-06-21 DIAGNOSIS — K080 Exfoliation of teeth due to systemic causes: Secondary | ICD-10-CM | POA: Diagnosis not present

## 2023-08-14 DIAGNOSIS — L218 Other seborrheic dermatitis: Secondary | ICD-10-CM | POA: Diagnosis not present

## 2023-08-14 DIAGNOSIS — M7130 Other bursal cyst, unspecified site: Secondary | ICD-10-CM | POA: Diagnosis not present

## 2023-08-14 DIAGNOSIS — L570 Actinic keratosis: Secondary | ICD-10-CM | POA: Diagnosis not present

## 2023-09-12 DIAGNOSIS — M25552 Pain in left hip: Secondary | ICD-10-CM | POA: Diagnosis not present

## 2023-09-12 DIAGNOSIS — M5410 Radiculopathy, site unspecified: Secondary | ICD-10-CM | POA: Diagnosis not present

## 2023-09-12 DIAGNOSIS — M79659 Pain in unspecified thigh: Secondary | ICD-10-CM | POA: Diagnosis not present

## 2023-09-14 DIAGNOSIS — M25552 Pain in left hip: Secondary | ICD-10-CM | POA: Diagnosis not present

## 2023-09-21 ENCOUNTER — Other Ambulatory Visit (HOSPITAL_COMMUNITY): Payer: Self-pay | Admitting: Orthopedic Surgery

## 2023-09-21 DIAGNOSIS — M25552 Pain in left hip: Secondary | ICD-10-CM

## 2023-09-25 ENCOUNTER — Encounter (HOSPITAL_COMMUNITY): Payer: Self-pay

## 2023-09-25 ENCOUNTER — Emergency Department (HOSPITAL_COMMUNITY): Payer: Medicare Other

## 2023-09-25 ENCOUNTER — Ambulatory Visit (HOSPITAL_COMMUNITY): Payer: Medicare Other

## 2023-09-25 ENCOUNTER — Inpatient Hospital Stay (HOSPITAL_COMMUNITY)
Admission: EM | Admit: 2023-09-25 | Discharge: 2023-09-29 | DRG: 645 | Disposition: A | Discharge status: Home / Self Care | Payer: Medicare Other | Attending: Internal Medicine | Admitting: Internal Medicine

## 2023-09-25 DIAGNOSIS — E782 Mixed hyperlipidemia: Secondary | ICD-10-CM | POA: Diagnosis not present

## 2023-09-25 DIAGNOSIS — K760 Fatty (change of) liver, not elsewhere classified: Secondary | ICD-10-CM | POA: Diagnosis present

## 2023-09-25 DIAGNOSIS — E871 Hypo-osmolality and hyponatremia: Secondary | ICD-10-CM | POA: Diagnosis not present

## 2023-09-25 DIAGNOSIS — Z7984 Long term (current) use of oral hypoglycemic drugs: Secondary | ICD-10-CM | POA: Diagnosis not present

## 2023-09-25 DIAGNOSIS — Z808 Family history of malignant neoplasm of other organs or systems: Secondary | ICD-10-CM | POA: Diagnosis not present

## 2023-09-25 DIAGNOSIS — M48061 Spinal stenosis, lumbar region without neurogenic claudication: Secondary | ICD-10-CM | POA: Diagnosis not present

## 2023-09-25 DIAGNOSIS — Z90710 Acquired absence of both cervix and uterus: Secondary | ICD-10-CM

## 2023-09-25 DIAGNOSIS — Z803 Family history of malignant neoplasm of breast: Secondary | ICD-10-CM

## 2023-09-25 DIAGNOSIS — E222 Syndrome of inappropriate secretion of antidiuretic hormone: Secondary | ICD-10-CM | POA: Diagnosis not present

## 2023-09-25 DIAGNOSIS — Z880 Allergy status to penicillin: Secondary | ICD-10-CM | POA: Diagnosis not present

## 2023-09-25 DIAGNOSIS — M4726 Other spondylosis with radiculopathy, lumbar region: Secondary | ICD-10-CM | POA: Diagnosis present

## 2023-09-25 DIAGNOSIS — M4807 Spinal stenosis, lumbosacral region: Secondary | ICD-10-CM | POA: Diagnosis not present

## 2023-09-25 DIAGNOSIS — I10 Essential (primary) hypertension: Secondary | ICD-10-CM | POA: Diagnosis not present

## 2023-09-25 DIAGNOSIS — E861 Hypovolemia: Secondary | ICD-10-CM | POA: Diagnosis not present

## 2023-09-25 DIAGNOSIS — M5416 Radiculopathy, lumbar region: Secondary | ICD-10-CM

## 2023-09-25 DIAGNOSIS — M160 Bilateral primary osteoarthritis of hip: Secondary | ICD-10-CM | POA: Diagnosis not present

## 2023-09-25 DIAGNOSIS — Z91018 Allergy to other foods: Secondary | ICD-10-CM | POA: Diagnosis not present

## 2023-09-25 DIAGNOSIS — Z1152 Encounter for screening for COVID-19: Secondary | ICD-10-CM

## 2023-09-25 DIAGNOSIS — E119 Type 2 diabetes mellitus without complications: Secondary | ICD-10-CM | POA: Diagnosis not present

## 2023-09-25 DIAGNOSIS — Z79899 Other long term (current) drug therapy: Secondary | ICD-10-CM | POA: Diagnosis not present

## 2023-09-25 DIAGNOSIS — M25552 Pain in left hip: Secondary | ICD-10-CM | POA: Diagnosis not present

## 2023-09-25 DIAGNOSIS — R519 Headache, unspecified: Secondary | ICD-10-CM | POA: Diagnosis not present

## 2023-09-25 DIAGNOSIS — R109 Unspecified abdominal pain: Secondary | ICD-10-CM | POA: Diagnosis not present

## 2023-09-25 DIAGNOSIS — R1084 Generalized abdominal pain: Secondary | ICD-10-CM | POA: Diagnosis not present

## 2023-09-25 DIAGNOSIS — K219 Gastro-esophageal reflux disease without esophagitis: Secondary | ICD-10-CM | POA: Diagnosis present

## 2023-09-25 DIAGNOSIS — T502X5A Adverse effect of carbonic-anhydrase inhibitors, benzothiadiazides and other diuretics, initial encounter: Secondary | ICD-10-CM | POA: Diagnosis not present

## 2023-09-25 DIAGNOSIS — M549 Dorsalgia, unspecified: Secondary | ICD-10-CM | POA: Diagnosis not present

## 2023-09-25 DIAGNOSIS — K900 Celiac disease: Secondary | ICD-10-CM | POA: Diagnosis not present

## 2023-09-25 DIAGNOSIS — E785 Hyperlipidemia, unspecified: Secondary | ICD-10-CM | POA: Diagnosis not present

## 2023-09-25 LAB — URINALYSIS, ROUTINE W REFLEX MICROSCOPIC
Bilirubin Urine: NEGATIVE
Bilirubin Urine: NEGATIVE
Glucose, UA: NEGATIVE mg/dL
Glucose, UA: NEGATIVE mg/dL
Hgb urine dipstick: NEGATIVE
Hgb urine dipstick: NEGATIVE
Ketones, ur: NEGATIVE mg/dL
Ketones, ur: NEGATIVE mg/dL
Leukocytes,Ua: NEGATIVE
Leukocytes,Ua: NEGATIVE
Nitrite: NEGATIVE
Nitrite: NEGATIVE
Protein, ur: 100 mg/dL — AB
Protein, ur: NEGATIVE mg/dL
Specific Gravity, Urine: 1.012 (ref 1.005–1.030)
Specific Gravity, Urine: 1.013 (ref 1.005–1.030)
pH: 7 (ref 5.0–8.0)
pH: 8 (ref 5.0–8.0)

## 2023-09-25 LAB — CBC
HCT: 41.5 % (ref 36.0–46.0)
HCT: 38.9 % (ref 36.0–46.0)
Hemoglobin: 15.5 g/dL — ABNORMAL HIGH (ref 12.0–15.0)
Hemoglobin: 14.3 g/dL (ref 12.0–15.0)
MCH: 34.5 pg — ABNORMAL HIGH (ref 26.0–34.0)
MCH: 34.2 pg — ABNORMAL HIGH (ref 26.0–34.0)
MCHC: 37.3 g/dL — ABNORMAL HIGH (ref 30.0–36.0)
MCHC: 36.8 g/dL — ABNORMAL HIGH (ref 30.0–36.0)
MCV: 92.4 fL (ref 80.0–100.0)
MCV: 93.1 fL (ref 80.0–100.0)
Platelets: 378 10*3/uL (ref 150–400)
Platelets: 328 10*3/uL (ref 150–400)
RBC: 4.49 MIL/uL (ref 3.87–5.11)
RBC: 4.18 MIL/uL (ref 3.87–5.11)
RDW: 11.1 % — ABNORMAL LOW (ref 11.5–15.5)
RDW: 11.2 % — ABNORMAL LOW (ref 11.5–15.5)
WBC: 8.7 10*3/uL (ref 4.0–10.5)
WBC: 9.6 10*3/uL (ref 4.0–10.5)
nRBC: 0 % (ref 0.0–0.2)
nRBC: 0 % (ref 0.0–0.2)

## 2023-09-25 LAB — BASIC METABOLIC PANEL
Anion gap: 14 (ref 5–15)
BUN: 10 mg/dL (ref 8–23)
CO2: 20 mmol/L — ABNORMAL LOW (ref 22–32)
Calcium: 8.7 mg/dL — ABNORMAL LOW (ref 8.9–10.3)
Chloride: 85 mmol/L — ABNORMAL LOW (ref 98–111)
Creatinine, Ser: 0.56 mg/dL (ref 0.44–1.00)
GFR, Estimated: >60 mL/min (ref >60)
Glucose, Bld: 114 mg/dL — ABNORMAL HIGH (ref 70–99)
Potassium: 2.9 mmol/L — ABNORMAL LOW (ref 3.5–5.1)
Sodium: 119 mmol/L — CL (ref 135–145)

## 2023-09-25 LAB — OSMOLALITY, URINE
Osmolality, Ur: 442 mosm/kg (ref 300–900)
Osmolality, Ur: 255 mosm/kg — ABNORMAL LOW (ref 300–900)

## 2023-09-25 LAB — SODIUM, URINE, RANDOM
Sodium, Ur: 75 mmol/L
Sodium, Ur: 52 mmol/L

## 2023-09-25 LAB — COMPREHENSIVE METABOLIC PANEL
ALT: 34 U/L (ref 0–44)
AST: 30 U/L (ref 15–41)
Albumin: 4.2 g/dL (ref 3.5–5.0)
Alkaline Phosphatase: 37 U/L — ABNORMAL LOW (ref 38–126)
Anion gap: 18 — ABNORMAL HIGH (ref 5–15)
BUN: 12 mg/dL (ref 8–23)
CO2: 16 mmol/L — ABNORMAL LOW (ref 22–32)
Calcium: 9.2 mg/dL (ref 8.9–10.3)
Chloride: 82 mmol/L — ABNORMAL LOW (ref 98–111)
Creatinine, Ser: 0.61 mg/dL (ref 0.44–1.00)
GFR, Estimated: >60 mL/min (ref >60)
Glucose, Bld: 135 mg/dL — ABNORMAL HIGH (ref 70–99)
Potassium: 3 mmol/L — ABNORMAL LOW (ref 3.5–5.1)
Sodium: 116 mmol/L — CL (ref 135–145)
Total Bilirubin: 1.1 mg/dL (ref 0.0–1.2)
Total Protein: 7.5 g/dL (ref 6.5–8.1)

## 2023-09-25 LAB — I-STAT VENOUS BLOOD GAS, ED
Acid-Base Excess: 2 mmol/L (ref 0.0–2.0)
Bicarbonate: 24.7 mmol/L (ref 20.0–28.0)
Calcium, Ion: 1.07 mmol/L — ABNORMAL LOW (ref 1.15–1.40)
HCT: 44 % (ref 36.0–46.0)
Hemoglobin: 15 g/dL (ref 12.0–15.0)
O2 Saturation: 64 %
Potassium: 3 mmol/L — ABNORMAL LOW (ref 3.5–5.1)
Sodium: 116 mmol/L — CL (ref 135–145)
TCO2: 26 mmol/L (ref 22–32)
pCO2, Ven: 33.3 mm[Hg] — ABNORMAL LOW (ref 44–60)
pH, Ven: 7.478 — ABNORMAL HIGH (ref 7.25–7.43)
pO2, Ven: 31 mm[Hg] — CL (ref 32–45)

## 2023-09-25 LAB — RESP PANEL BY RT-PCR (RSV, FLU A&B, COVID)  RVPGX2
Influenza A by PCR: NEGATIVE
Influenza B by PCR: NEGATIVE
Resp Syncytial Virus by PCR: NEGATIVE
SARS Coronavirus 2 by RT PCR: NEGATIVE

## 2023-09-25 LAB — OSMOLALITY
Osmolality: 244 mosm/kg — CL (ref 275–295)
Osmolality: 255 mosm/kg — ABNORMAL LOW (ref 275–295)

## 2023-09-25 LAB — LIPASE, BLOOD: Lipase: 39 U/L (ref 11–51)

## 2023-09-25 LAB — MRSA NEXT GEN BY PCR, NASAL: MRSA by PCR Next Gen: NOT DETECTED

## 2023-09-25 MED ORDER — POLYETHYLENE GLYCOL 3350 17 G PO PACK: 17.0000 g | PACK | Freq: Every day | ORAL | Status: DC | PRN

## 2023-09-25 MED ORDER — IOHEXOL 350 MG/ML SOLN
75.0000 mL | Freq: Once | INTRAVENOUS | Status: AC | PRN
  Administered 2023-09-25: 75 mL via INTRAVENOUS

## 2023-09-25 MED ORDER — POTASSIUM CHLORIDE CRYS ER 20 MEQ PO TBCR
40.0000 meq | EXTENDED_RELEASE_TABLET | Freq: Once | ORAL | Status: AC
  Administered 2023-09-25: 40 meq via ORAL
  Filled 2023-09-25: qty 2

## 2023-09-25 MED ORDER — FAMOTIDINE 20 MG PO TABS
20.0000 mg | ORAL_TABLET | Freq: Two times a day (BID) | ORAL | Status: DC
  Administered 2023-09-26 – 2023-09-29 (×7): 20 mg via ORAL
  Filled 2023-09-25 (×8): qty 1

## 2023-09-25 MED ORDER — CHLORHEXIDINE GLUCONATE CLOTH 2 % EX PADS
6.0000 | MEDICATED_PAD | Freq: Every day | CUTANEOUS | Status: DC
  Administered 2023-09-25 – 2023-09-28 (×4): 6 via TOPICAL

## 2023-09-25 MED ORDER — ENOXAPARIN SODIUM 40 MG/0.4ML IJ SOSY
40.0000 mg | PREFILLED_SYRINGE | INTRAMUSCULAR | Status: DC
  Administered 2023-09-25: 40 mg via SUBCUTANEOUS
  Filled 2023-09-25: qty 0.4

## 2023-09-25 MED ORDER — PRAVASTATIN SODIUM 40 MG PO TABS
40.0000 mg | ORAL_TABLET | Freq: Every day | ORAL | Status: DC
  Administered 2023-09-26 – 2023-09-29 (×4): 40 mg via ORAL
  Filled 2023-09-25 (×4): qty 1

## 2023-09-25 MED ORDER — METOPROLOL SUCCINATE ER 25 MG PO TB24
25.0000 mg | ORAL_TABLET | Freq: Every day | ORAL | Status: DC
  Administered 2023-09-26 – 2023-09-29 (×4): 25 mg via ORAL
  Filled 2023-09-25 (×4): qty 1

## 2023-09-25 MED ORDER — SODIUM CHLORIDE 0.9 % IV SOLN: INTRAVENOUS | Status: DC

## 2023-09-25 MED ORDER — ONDANSETRON HCL 4 MG/2ML IJ SOLN: 4.0000 mg | Freq: Four times a day (QID) | INTRAMUSCULAR | Status: DC | PRN

## 2023-09-25 MED ORDER — POTASSIUM CHLORIDE 20 MEQ/100ML IV SOLN: 20.0000 meq | Freq: Once | INTRAVENOUS | Status: DC

## 2023-09-25 MED ORDER — POTASSIUM CHLORIDE 10 MEQ/100ML IV SOLN
10.0000 meq | INTRAVENOUS | Status: AC
Start: 2023-09-25 — End: 2023-09-26
  Administered 2023-09-25 – 2023-09-26 (×2): 10 meq via INTRAVENOUS
  Filled 2023-09-25 (×2): qty 100

## 2023-09-25 MED ORDER — VITAMIN D 25 MCG (1000 UNIT) PO TABS: 1000.0000 [IU] | ORAL_TABLET | Freq: Every day | ORAL | Status: DC | PRN

## 2023-09-25 MED ORDER — TRAMADOL HCL 50 MG PO TABS
50.0000 mg | ORAL_TABLET | Freq: Four times a day (QID) | ORAL | Status: DC | PRN
  Administered 2023-09-28: 50 mg via ORAL
  Filled 2023-09-25: qty 1

## 2023-09-25 MED ORDER — DOCUSATE SODIUM 100 MG PO CAPS: 100.0000 mg | ORAL_CAPSULE | Freq: Two times a day (BID) | ORAL | Status: DC | PRN

## 2023-09-25 MED ORDER — SODIUM CHLORIDE 0.9 % IV BOLUS
1000.0000 mL | Freq: Once | INTRAVENOUS | Status: AC
  Administered 2023-09-25: 1000 mL via INTRAVENOUS

## 2023-09-25 MED ORDER — AMLODIPINE BESYLATE 5 MG PO TABS
2.5000 mg | ORAL_TABLET | Freq: Every day | ORAL | Status: DC
  Administered 2023-09-25 – 2023-09-29 (×5): 2.5 mg via ORAL
  Filled 2023-09-25 (×5): qty 1

## 2023-09-25 NOTE — Progress Notes (Signed)
 eLink Physician-Brief Progress Note Patient Name: Jessica Conley DOB: 1946-04-13 MRN: [id number]   Date of Service  09/25/2023  HPI/Events of Note  77/F with HTN, DM, dyslipidemia presenting with abdominal pain.  CT abdomen and pelvis negative for acute pathology.  She was noted to have Na of 116, prompting ICU admission. She takes HCTZ at home.  BP, HR 66, RR 15, O2 sats 97%   eICU Interventions  Follow serum Na.  Goal to increase by 6-8 in the first 24 hours. Pt is on NS. Replete K.  Resume amlodipine, metoprolol.  Continue statin. Insulin for glucose control.  PPI. Lovenox for DVT prophylaxis.      Intervention Category Evaluation Type: New Patient Evaluation  Larinda Buttery 09/25/2023, 10:10 PM

## 2023-09-25 NOTE — ED Provider Triage Note (Signed)
 Emergency Medicine Provider Triage Evaluation Note  Jessica Conley , a 78 y.o. female  was evaluated in triage.  Pt complains of abdominal pain.  Reports that for the last 1 week she has been taking prednisone for pain in her left hip.  States that beginning 3 days ago she developed "rumbling" in her abdomen.  Denies pain.  Denies nausea, vomiting or diarrhea.  Denies dysuria or fevers.  Review of Systems  Positive:  Negative:   Physical Exam  BP (!) 157/95 (BP Location: Right Arm)   Pulse 65   Temp (!) 97.4 F (36.3 C)   Resp 18   Ht 5\' 8"  (1.727 m)   Wt 68 kg   SpO2 100%   BMI 22.81 kg/m  Gen:   Awake, no distress   Resp:  Normal effort  MSK:   Moves extremities without difficulty  Other:    Medical Decision Making  Medically screening exam initiated at 2:38 PM.  Appropriate orders placed.  Jessica Conley was informed that the remainder of the evaluation will be completed by another provider, this initial triage assessment does not replace that evaluation, and the importance of remaining in the ED until their evaluation is complete.     Jessica Decant, PA-C 09/25/23 1439

## 2023-09-25 NOTE — Progress Notes (Signed)
 eLink Physician-Brief Progress Note Patient Name: Jessica Conley DOB: Dec 18, 1945 MRN: [id number]   Date of Service  09/25/2023  HPI/Events of Note  Na at 119 <-- 116.  Pt is on NS at 40cc/hr.   K is at 2.9, crea 0.56.  eICU Interventions  Lower NS to 20cc/hr.  Replete K - PO and IV potassium chloride.       Intervention Category Intermediate Interventions: Electrolyte abnormality - evaluation and management  Larinda Buttery 09/25/2023, 10:49 PM

## 2023-09-25 NOTE — ED Provider Notes (Signed)
 [place] EMERGENCY DEPARTMENT AT [facility] Provider Note   CSN: [id number] Arrival date & time: 09/25/23  1400     History  Chief Complaint  Patient presents with   Abdominal Pain   Headache    Jessica Conley is a 78 y.o. female.  Patient here with generalized abdominal pain headache for the last couple days general fatigue that is increasing and becoming more.  She has been on prednisone for her left hip recently.  She takes some blood pressure meds and diabetes meds.  She denies any trauma.  She denies any weakness numbness tingling.  She does have some intermittent sciatic pain down the left leg at times.  She says she has been try to drink plenty of fluids.  She denies any nausea vomiting diarrhea.  The history is provided by the patient.       Home Medications Prior to Admission medications   Medication Sig Start Date End Date Taking? Authorizing Provider  amLODipine (NORVASC) 2.5 MG tablet Take 2.5 mg by mouth daily.   Yes [provider]  Cholecalciferol (VITAMIN D-3) 125 MCG (5000 UT) TABS Take 5,000 Units by mouth daily as needed (When she remembers to take it).   Yes [provider]  losartan-hydrochlorothiazide (HYZAAR) 100-25 MG per tablet Take 1 tablet by mouth daily.   Yes Pearson Grippe, MD  metFORMIN (GLUCOPHAGE) 500 MG tablet Take 500 mg by mouth daily.   Yes [provider]  metoprolol succinate (TOPROL-XL) 50 MG 24 hr tablet TAKE 1 TABLET(50 MG) BY MOUTH DAILY WITH OR IMMEDIATELY FOLLOWING A MEAL Patient taking differently: Take 25 mg by mouth daily. 01/23/23  Yes Patwardhan, Manish J, MD  omeprazole (PRILOSEC) 40 MG capsule Take 40 mg by mouth daily. 10/10/19  Yes [provider]  Polyethyl Glycol-Propyl Glycol (SYSTANE) 0.4-0.3 % SOLN Place 1 drop into both eyes every morning.   Yes [provider]  pravastatin (PRAVACHOL) 40 MG tablet Take 40 mg by mouth daily. Every other day   Yes [provider]  traMADol (ULTRAM) 50 MG tablet Take 50 mg by mouth every 6 (six) hours as needed for moderate pain (pain score 4-6) or severe pain (pain score 7-10). 09/18/23  Yes [provider]  vitamin B-12 (CYANOCOBALAMIN) 100 MCG tablet Take 100 mcg by mouth 2 (two) times a week.   Yes [provider]  predniSONE (DELTASONE) 10 MG tablet Take 10 mg by mouth See admin instructions. 2 tablets in the am with food for 3 days then 1 for 4 days Orally Once a day for 7 days Patient not taking: Reported on 09/25/2023 09/12/23   [provider]      Allergies    Wheat and Penicillins    Review of Systems   Review of Systems  Physical Exam Updated Vital Signs BP (!) 159/92   Pulse 64   Temp 97.6 F (36.4 C) (Oral)   Resp 16   Ht 5\' 8"  (1.727 m)   Wt 68 kg   SpO2 98%   BMI 22.81 kg/m  Physical Exam Vitals and nursing note reviewed.  Constitutional:      General: She is not in acute distress.    Appearance: She is well-developed. She is not ill-appearing.  HENT:     Head: Normocephalic and atraumatic.     Mouth/Throat:     Mouth: Mucous membranes are moist.  Eyes:     Extraocular Movements: Extraocular movements intact.  Conjunctiva/sclera: Conjunctivae normal.     Pupils: Pupils are equal, round, and reactive to light.  Cardiovascular:     Rate and Rhythm: Normal rate and regular rhythm.     Heart sounds: Normal heart sounds. No murmur heard. Pulmonary:     Effort: Pulmonary effort is normal. No respiratory distress.     Breath sounds: Normal breath sounds.  Abdominal:     Palpations: Abdomen is soft.     Tenderness: There is abdominal tenderness.  Musculoskeletal:        General: No swelling.     Cervical back: Neck supple.  Skin:    General: Skin is warm and dry.     Capillary Refill: Capillary refill takes less than 2 seconds.  Neurological:     General: No focal deficit present.     Mental Status: She is alert and oriented to person,  place, and time.     Cranial Nerves: No cranial nerve deficit.     Motor: No weakness.  Psychiatric:        Mood and Affect: Mood normal.     ED Results / Procedures / Treatments   Labs (all labs ordered are listed, but only abnormal results are displayed) Labs Reviewed  COMPREHENSIVE METABOLIC PANEL - Abnormal; Notable for the following components:      Result Value   Sodium 116 (*)    Potassium 3.0 (*)    Chloride 82 (*)    CO2 16 (*)    Glucose, Bld 135 (*)    Alkaline Phosphatase 37 (*)    Anion gap 18 (*)    All other components within normal limits  CBC - Abnormal; Notable for the following components:   Hemoglobin 15.5 (*)    MCH 34.5 (*)    MCHC 37.3 (*)    RDW 11.1 (*)    All other components within normal limits  URINALYSIS, ROUTINE W REFLEX MICROSCOPIC - Abnormal; Notable for the following components:   APPearance CLOUDY (*)    Protein, ur 100 (*)    Bacteria, UA RARE (*)    All other components within normal limits  I-STAT VENOUS BLOOD GAS, ED - Abnormal; Notable for the following components:   pH, Ven 7.478 (*)    pCO2, Ven 33.3 (*)    pO2, Ven 31 (*)    Sodium 116 (*)    Potassium 3.0 (*)    Calcium, Ion 1.07 (*)    All other components within normal limits  RESP PANEL BY RT-PCR (RSV, FLU A&B, COVID)  RVPGX2  LIPASE, BLOOD  SODIUM, URINE, RANDOM  OSMOLALITY, URINE  OSMOLALITY    EKG None  Radiology CT ABDOMEN PELVIS W CONTRAST Result Date: 09/25/2023 CLINICAL DATA:  Abdominal pain.  Headache. EXAM: CT ABDOMEN AND PELVIS WITH CONTRAST TECHNIQUE: Multidetector CT imaging of the abdomen and pelvis was performed using the standard protocol following bolus administration of intravenous contrast. RADIATION DOSE REDUCTION: This exam was performed according to the departmental dose-optimization program which includes automated exposure control, adjustment of the mA and/or kV according to patient size and/or use of iterative reconstruction technique.  CONTRAST:  75mL OMNIPAQUE IOHEXOL 350 MG/ML SOLN COMPARISON:  Abdominal MRI 02/04/2019, CT chest 03/24/2019 FINDINGS: Lower chest: Nodular density in the RIGHT middle lobe on the first slice (image 1/5) corresponds to a benign vascular structure on comparison CT. Mild basilar atelectasis. Hepatobiliary: Round lesion the posterior RIGHT hepatic lobe measuring 11 mm corresponds to a benign cyst on comparison MRI. Delayed phase imaging  demonstrates low-attenuation liver suggesting hepatic steatosis. Findings are also confirmed on comparison MRI. Gallbladder is normal. Common bile duct normal. Pancreas: Pancreas is normal. No ductal dilatation. No pancreatic inflammation. Spleen: Normal spleen Adrenals/urinary tract: Adrenal glands and kidneys are normal. The ureters and bladder normal. Stomach/Bowel: Stomach, small bowel, appendix, and cecum are normal. The colon and rectosigmoid colon are normal. Vascular/Lymphatic: Abdominal aorta is normal caliber. No periportal or retroperitoneal adenopathy. No pelvic adenopathy. Reproductive: Post hysterectomy. Nodular soft tissue thickening in the pelvic floor anterior to the base the bladder appears benign. Potential postsurgical ( sagittal image 114/series 7). Other: No free fluid. Musculoskeletal: No aggressive osseous lesion. IMPRESSION: 1. No acute findings in the abdomen pelvis. 2. Hepatic steatosis. 3. Normal gallbladder and biliary tree. 4. Normal appendix. 5. Post hysterectomy. Electronically Signed   By: Genevive Bi M.D.   On: 09/25/2023 18:21   CT Head Wo Contrast Result Date: 09/25/2023 CLINICAL DATA:  Headache EXAM: CT HEAD WITHOUT CONTRAST TECHNIQUE: Contiguous axial images were obtained from the base of the skull through the vertex without intravenous contrast. RADIATION DOSE REDUCTION: This exam was performed according to the departmental dose-optimization program which includes automated exposure control, adjustment of the mA and/or kV according to  patient size and/or use of iterative reconstruction technique. COMPARISON:  None Available. FINDINGS: Brain: No evidence of acute infarction, hemorrhage, hydrocephalus, extra-axial collection or mass lesion/mass effect. Vascular: No hyperdense vessel or unexpected calcification. Skull: Normal. Negative for fracture or focal lesion. Sinuses/Orbits: No acute finding. Other: None. IMPRESSION: No acute intracranial abnormality. Electronically Signed   By: Darliss Cheney M.D.   On: 09/25/2023 17:59    Procedures .Critical Care  Performed by: Virgina Norfolk, DO Authorized by: Virgina Norfolk, DO   Critical care provider statement:    Critical care time (minutes):  35   Critical care was necessary to treat or prevent imminent or life-threatening deterioration of the following conditions: hyponatremia.   Critical care was time spent personally by me on the following activities:  Blood draw for specimens, development of treatment plan with patient or surrogate, discussions with primary provider, evaluation of patient's response to treatment, examination of patient, obtaining history from patient or surrogate, ordering and performing treatments and interventions, ordering and review of laboratory studies, ordering and review of radiographic studies, pulse oximetry, re-evaluation of patient's condition and review of old charts   Care discussed with: admitting provider       Medications Ordered in ED Medications  sodium chloride 0.9 % bolus 1,000 mL (1,000 mLs Intravenous New Bag/Given 09/25/23 1708)  iohexol (OMNIPAQUE) 350 MG/ML injection 75 mL (75 mLs Intravenous Contrast Given 09/25/23 1736)    ED Course/ Medical Decision Making/ A&P                                 Medical Decision Making Amount and/or Complexity of Data Reviewed Labs: ordered. Radiology: ordered.  Risk Prescription drug management. Decision regarding hospitalization.   Jessica Conley is here with generalized weakness  abdominal pain headaches ongoing left hip pain.  Just started some steroids/finished steroids for her left hip.  She denies any chest pain shortness of breath.  Denies any nausea vomit diarrhea.  Differential diagnosis could be infectious process dehydration steroid side effect less likely bowel obstruction appendicitis.  Will get CBC CMP lipase urinalysis CT scan abdomen and pelvis CT head.  Will give IV fluids.  Sodium is 116 per my review  and interpretation.  pH is normal.  Urinalysis negative for infection.  Bicarb 16 anion gap little bit elevated.  CT scan of the head and abdomen are unremarkable.  She is on mild diuretic for blood pressure.  Not sure if this is medication related.  She does look clinically dry and do suspect hypovolemic hyponatremia.  I given her fluid bolus.  Ultimately I will admit her to medicine to further correct hyponatremia which I think is the cause of her generalized weakness.  This chart was dictated using voice recognition software.  Despite best efforts to proofread,  errors can occur which can change the documentation meaning.         Final Clinical Impression(s) / ED Diagnoses Final diagnoses:  Hyponatremia    Rx / DC Orders ED Discharge Orders     None         Virgina Norfolk, DO 09/25/23 1856

## 2023-09-25 NOTE — ED Triage Notes (Signed)
 Pt c/o generalized abdominal pain and headache x3 days.  Pain score 8/10.  Denies n/v/d.  Pt reports she has been on prednisone for her hip.

## 2023-09-25 NOTE — H&P (Signed)
 NAME:  Jessica Conley, MRN:  [id number], DOB:  09-10-1945, LOS: 0 ADMISSION DATE:  09/25/2023, CONSULTATION DATE:  09/25/23 REFERRING MD:  Dr. Lockie Mola ER, CHIEF COMPLAINT:  Abdominal pain   History of Present Illness:    78 year old female patient with history of hypertension, type 2 diabetes mellitus, hyperlipidemia, recent negative coronary angiogram presents now on 09/25/2023 to [facility] emergency room chief complaint of 7 out of 10 abdominal pain, associated with a headache and fatigue, increasing, generalized sensation, some burning, progresses well that she sought medical attention. She denies nausea and vomiting. She has some generalized pain and arthritic symptoms taking prednisone.,  Also admits to headache as above. In the emergency room, patient was found to have sodium of 116, bicarb of 16, anion gap slightly elevated. CT abdomen pelvis negative for acute pathology, CT head also negative. She has noted to take diuretics at home.  Initially patient was planned to be admitted to the hospitalist service; however, given low sodium and concern for acute decline she will not be admitted to the ICU.  Pertinent  Medical History   has a past medical history of Celiac sprue, Hemochromatosis, hereditary (HCC) (05/18/2010), and Hypertension.   Significant Hospital Events: Including procedures, antibiotic start and stop dates in addition to other pertinent events     Interim History / Subjective:    Objective   Blood pressure (!) 159/92, pulse 64, temperature 97.6 F (36.4 C), temperature source Oral, resp. rate 16, height 5\' 8"  (1.727 m), weight 68 kg, SpO2 98%.       No intake or output data in the 24 hours ending 09/25/23 1959 Filed Weights   09/25/23 1412  Weight: 68 kg    Examination: Physical Exam Vitals and nursing note reviewed.  Constitutional:      Appearance: She is well-developed.  HENT:     Head: Normocephalic and atraumatic.  Eyes:     Extraocular  Movements: Extraocular movements intact.     Pupils: Pupils are equal, round, and reactive to light.  Cardiovascular:     Rate and Rhythm: Normal rate and regular rhythm.  Pulmonary:     Effort: Pulmonary effort is normal.     Breath sounds: Normal breath sounds.  Abdominal:     General: Abdomen is flat. Bowel sounds are normal.     Palpations: Abdomen is soft.     Tenderness: There is generalized abdominal tenderness.  Skin:    General: Skin is warm and dry.  Neurological:     General: No focal deficit present.     Mental Status: She is alert and oriented to person, place, and time.  Psychiatric:        Mood and Affect: Mood normal.        Behavior: Behavior normal.      Resolved Hospital Problem list     Assessment & Plan:   Hypovolemic Hyponatremia: -Likely due to diuretics, patient already received 1 L NS bolus. Every 6 "sodium checks, initial sodium 116, monitor for the next 12 to 24 hours to avoid overcorrection, goal sodium around 124. Start patient on IV NS at 40 cc/h, will monitor levels. Check UA, urine osmolarity and serum osmolarity.  Hypertension - resume home norvasc - hold Hyzaar due to hyponatremia - pt reports baseline heart rate is in the 50-60 range, ok to resume home metorprolol.  DM: - tight glycemic control - hold metformin  HLD: - resume pravastatin  Further recs pending clinical course eLink TeleICU also on board  Best Practice (right click and "Reselect all SmartList Selections" daily)   Diet/type: renal diet DVT prophylaxis LMWH Pressure ulcer(s): N/A GI prophylaxis: H2B Lines: N/A Foley:  N/A Code Status:  full code Last date of multidisciplinary goals of care discussion [09/26/23]  Labs   CBC: Recent Labs  Lab 09/25/23 1421 09/25/23 1821  WBC 8.7  --   HGB 15.5* 15.0  HCT 41.5 44.0  MCV 92.4  --   PLT 378  --     Basic Metabolic Panel: Recent Labs  Lab 09/25/23 1421 09/25/23 1821  NA 116* 116*  K 3.0* 3.0*  CL  82*  --   CO2 16*  --   GLUCOSE 135*  --   BUN 12  --   CREATININE 0.61  --   CALCIUM 9.2  --    GFR: Estimated Creatinine Clearance: 57.5 mL/min (by C-G formula based on SCr of 0.61 mg/dL). Recent Labs  Lab 09/25/23 1421  WBC 8.7    Liver Function Tests: Recent Labs  Lab 09/25/23 1421  AST 30  ALT 34  ALKPHOS 37*  BILITOT 1.1  PROT 7.5  ALBUMIN 4.2   Recent Labs  Lab 09/25/23 1421  LIPASE 39   No results for input(s): "AMMONIA" in the last 168 hours.  ABG    Component Value Date/Time   HCO3 24.7 09/25/2023 1821   TCO2 26 09/25/2023 1821   O2SAT 64 09/25/2023 1821     Coagulation Profile: No results for input(s): "INR", "PROTIME" in the last 168 hours.  Cardiac Enzymes: No results for input(s): "CKTOTAL", "CKMB", "CKMBINDEX", "TROPONINI" in the last 168 hours.  HbA1C: No results found for: "HGBA1C"  CBG: No results for input(s): "GLUCAP" in the last 168 hours.  Review of Systems:   Review of Systems  Constitutional: Negative.   HENT: Negative.    Eyes: Negative.   Respiratory: Negative.    Cardiovascular: Negative.   Gastrointestinal:  Positive for abdominal pain.  Genitourinary: Negative.   Musculoskeletal: Negative.   Skin: Negative.   Neurological:  Positive for headaches.  Endo/Heme/Allergies: Negative.   Psychiatric/Behavioral: Negative.       Past Medical History:  She,  has a past medical history of Celiac sprue, Hemochromatosis, hereditary (HCC) (05/18/2010), and Hypertension.   Surgical History:   Past Surgical History:  Procedure Laterality Date   EYE SURGERY     LEFT HEART CATH AND CORONARY ANGIOGRAPHY N/A 08/01/2022   Procedure: LEFT HEART CATH AND CORONARY ANGIOGRAPHY;  Surgeon: Elder Negus, MD;  Location: [facility] INVASIVE CV LAB;  Service: Cardiovascular;  Laterality: N/A;   PHLEBOTOMY THERAPEUTIC  08/21/2011       VAGINAL HYSTERECTOMY  early age 81s   prolapse     Social History:   reports that she has never smoked.  She has never used smokeless tobacco. She reports current alcohol use of about 4.0 standard drinks of alcohol per week. She reports that she does not use drugs.   Family History:  Her family history includes Breast cancer (age of onset: 70) in her daughter; Heart attack in her mother; Melanoma in her father.   Allergies Allergies  Allergen Reactions   Wheat     ciliac disease   Penicillins Rash    Waterblisters      Home Medications  Prior to Admission medications   Medication Sig Start Date End Date Taking? Authorizing Provider  amLODipine (NORVASC) 2.5 MG tablet Take 2.5 mg by mouth daily.   Yes [provider]  Cholecalciferol (VITAMIN D-3) 125 MCG (5000 UT) TABS Take 5,000 Units by mouth daily as needed (When she remembers to take it).   Yes [provider]  losartan-hydrochlorothiazide (HYZAAR) 100-25 MG per tablet Take 1 tablet by mouth daily.   Yes Pearson Grippe, MD  metFORMIN (GLUCOPHAGE) 500 MG tablet Take 500 mg by mouth daily.   Yes [provider]  metoprolol succinate (TOPROL-XL) 50 MG 24 hr tablet TAKE 1 TABLET(50 MG) BY MOUTH DAILY WITH OR IMMEDIATELY FOLLOWING A MEAL Patient taking differently: Take 25 mg by mouth daily. 01/23/23  Yes Patwardhan, Manish J, MD  omeprazole (PRILOSEC) 40 MG capsule Take 40 mg by mouth daily. 10/10/19  Yes [provider]  Polyethyl Glycol-Propyl Glycol (SYSTANE) 0.4-0.3 % SOLN Place 1 drop into both eyes every morning.   Yes [provider]  pravastatin (PRAVACHOL) 40 MG tablet Take 40 mg by mouth daily. Every other day   Yes [provider]  traMADol (ULTRAM) 50 MG tablet Take 50 mg by mouth every 6 (six) hours as needed for moderate pain (pain score 4-6) or severe pain (pain score 7-10). 09/18/23  Yes [provider]  vitamin B-12 (CYANOCOBALAMIN) 100 MCG tablet Take 100 mcg by mouth 2 (two) times a week.   Yes [provider]  predniSONE (DELTASONE) 10 MG tablet Take 10 mg  by mouth See admin instructions. 2 tablets in the am with food for 3 days then 1 for 4 days Orally Once a day for 7 days Patient not taking: Reported on 09/25/2023 09/12/23   [provider]     Critical care time:   CRITICAL CARE Performed by: Madaline Brilliant DO Pulmonary/Critical Care Medicine   Total critical care time: 50 minutes  Critical care time was exclusive of separately billable procedures and treating other patients.  Critical care was necessary to treat or prevent imminent or life-threatening deterioration.  Critical care was time spent personally by me on the following activities: development of treatment plan with patient and/or surrogate as well as nursing, discussions with consultants, evaluation of patient's response to treatment, examination of patient, obtaining history from patient or surrogate, ordering and performing treatments and interventions, ordering and review of laboratory studies, ordering and review of radiographic studies, pulse oximetry and re-evaluation of patient's condition.

## 2023-09-25 NOTE — Progress Notes (Signed)
 Date and time results received: 09/25/23 10:37 PM  Test: Na Critical Value: 119  Name of Provider Notified: E-Link  Orders Received? Or Actions Taken?:  Waiting on further instructions.

## 2023-09-26 ENCOUNTER — Other Ambulatory Visit: Payer: Self-pay

## 2023-09-26 DIAGNOSIS — E871 Hypo-osmolality and hyponatremia: Secondary | ICD-10-CM | POA: Diagnosis not present

## 2023-09-26 DIAGNOSIS — I10 Essential (primary) hypertension: Secondary | ICD-10-CM | POA: Diagnosis not present

## 2023-09-26 DIAGNOSIS — E119 Type 2 diabetes mellitus without complications: Secondary | ICD-10-CM | POA: Diagnosis not present

## 2023-09-26 DIAGNOSIS — E785 Hyperlipidemia, unspecified: Secondary | ICD-10-CM | POA: Diagnosis not present

## 2023-09-26 LAB — BASIC METABOLIC PANEL
Anion gap: 11 (ref 5–15)
Anion gap: 12 (ref 5–15)
Anion gap: 12 (ref 5–15)
BUN: 9 mg/dL (ref 8–23)
BUN: 11 mg/dL (ref 8–23)
BUN: 12 mg/dL (ref 8–23)
CO2: 21 mmol/L — ABNORMAL LOW (ref 22–32)
CO2: 19 mmol/L — ABNORMAL LOW (ref 22–32)
CO2: 23 mmol/L (ref 22–32)
Calcium: 8.5 mg/dL — ABNORMAL LOW (ref 8.9–10.3)
Calcium: 9.1 mg/dL (ref 8.9–10.3)
Calcium: 9.2 mg/dL (ref 8.9–10.3)
Chloride: 88 mmol/L — ABNORMAL LOW (ref 98–111)
Chloride: 90 mmol/L — ABNORMAL LOW (ref 98–111)
Chloride: 88 mmol/L — ABNORMAL LOW (ref 98–111)
Creatinine, Ser: 0.55 mg/dL (ref 0.44–1.00)
Creatinine, Ser: 0.82 mg/dL (ref 0.44–1.00)
Creatinine, Ser: 0.69 mg/dL (ref 0.44–1.00)
GFR, Estimated: >60 mL/min (ref >60)
GFR, Estimated: >60 mL/min (ref >60)
GFR, Estimated: >60 mL/min (ref >60)
Glucose, Bld: 126 mg/dL — ABNORMAL HIGH (ref 70–99)
Glucose, Bld: 134 mg/dL — ABNORMAL HIGH (ref 70–99)
Glucose, Bld: 98 mg/dL (ref 70–99)
Potassium: 3.5 mmol/L (ref 3.5–5.1)
Potassium: 3.5 mmol/L (ref 3.5–5.1)
Potassium: 3.8 mmol/L (ref 3.5–5.1)
Sodium: 120 mmol/L — ABNORMAL LOW (ref 135–145)
Sodium: 121 mmol/L — ABNORMAL LOW (ref 135–145)
Sodium: 123 mmol/L — ABNORMAL LOW (ref 135–145)

## 2023-09-26 LAB — CBC
Hemoglobin: 14.6 g/dL (ref 12.0–15.0)
Platelets: 317 10*3/uL (ref 150–400)
WBC: 9.9 10*3/uL (ref 4.0–10.5)
nRBC: 0 % (ref 0.0–0.2)

## 2023-09-26 LAB — PHOSPHORUS: Phosphorus: 2.5 mg/dL (ref 2.5–4.6)

## 2023-09-26 LAB — MAGNESIUM: Magnesium: 1.9 mg/dL (ref 1.7–2.4)

## 2023-09-26 LAB — GLUCOSE, CAPILLARY
Glucose-Capillary: 113 mg/dL — ABNORMAL HIGH (ref 70–99)
Glucose-Capillary: 133 mg/dL — ABNORMAL HIGH (ref 70–99)

## 2023-09-26 MED ORDER — DICLOFENAC SODIUM 1 % EX GEL
4.0000 g | Freq: Four times a day (QID) | CUTANEOUS | Status: DC
  Administered 2023-09-26 – 2023-09-29 (×8): 4 g via TOPICAL
  Filled 2023-09-26: qty 100

## 2023-09-26 MED ORDER — SODIUM CHLORIDE 0.9 % IV SOLN: INTRAVENOUS | Status: DC

## 2023-09-26 NOTE — TOC Initial Note (Signed)
 Transition of Care [facility]) - Initial/Assessment Note    Patient Details  Name: Jessica Conley MRN: [id number] Date of Birth: [birth year]  Transition of Care [facility]) CM/SW Contact:    Marliss Coots, LCSW Phone Number: 09/26/2023, 4:44 PM  Clinical Narrative:                  4:44 PM Per chart review, patient does not have SDOH needs.    Barriers to Discharge: Continued Medical Work up   Patient Goals and CMS Choice            Expected Discharge Plan and Services       Living arrangements for the past 2 months: Single Family Home                                      Prior Living Arrangements/Services Living arrangements for the past 2 months: Single Family Home   Patient language and need for interpreter reviewed:: Yes              Criminal Activity/Legal Involvement Pertinent to Current Situation/Hospitalization: No - Comment as needed  Activities of Daily Living      Permission Sought/Granted Permission sought to share information with : Family Supports Permission granted to share information with : No (Contact information on chart)  Share Information with NAME: Klarisa Barman     Permission granted to share info w Relationship: Spouse  Permission granted to share info w Contact Information: [phone]  Emotional Assessment       Orientation: : Oriented to Place, Oriented to Self, Oriented to  Time, Oriented to Situation Alcohol / Substance Use: Not Applicable Psych Involvement: No (comment)  Admission diagnosis:  Acute hyponatremia [E87.1] Hyponatremia [E87.1] Patient Active Problem List   Diagnosis Date Noted   Hyponatremia 09/25/2023   Abnormal stress test 08/01/2022   Type 2 diabetes mellitus without complication, without long-term current use of insulin (HCC) 06/12/2022   Essential hypertension 06/12/2022   Chest heaviness 06/12/2022   Mixed hyperlipidemia 06/12/2022   Hearing loss 03/26/2020   Pain in finger of left  hand 01/15/2018   Hemochromatosis, hereditary (HCC) 05/18/2010   PCP:  Irena Reichmann, DO Pharmacy:   [id number] [place], [place] - [address] AT [facility] OF [address] & [facility] [address] [place] [address] Phone: [phone] Fax: [phone]     Social Drivers of Health (SDOH) Social History: SDOH Screenings   Food Insecurity: No Food Insecurity (09/26/2023)  Housing: Unknown (09/26/2023)  Transportation Needs: No Transportation Needs (09/26/2023)  Utilities: Not At Risk (09/26/2023)  Depression (PHQ2-9): Low Risk  (11/08/2022)  Social Connections: Socially Integrated (09/26/2023)  Tobacco Use: Low Risk  (09/25/2023)   SDOH Interventions:     Readmission Risk Interventions     No data to display

## 2023-09-26 NOTE — Plan of Care (Signed)
  Problem: Education: Goal: Knowledge of General Education information will improve Description: Including pain rating scale, medication(s)/side effects and non-pharmacologic comfort measures Outcome: Progressing   Problem: Clinical Measurements: Goal: Ability to maintain clinical measurements within normal limits will improve Outcome: Progressing Goal: Will remain free from infection Outcome: Progressing Goal: Diagnostic test results will improve Outcome: Progressing Goal: Respiratory complications will improve Outcome: Progressing Goal: Cardiovascular complication will be avoided Outcome: Progressing   Problem: Activity: Goal: Risk for activity intolerance will decrease Outcome: Progressing   Problem: Coping: Goal: Level of anxiety will decrease Outcome: Progressing   Problem: Elimination: Goal: Will not experience complications related to bowel motility Outcome: Progressing Goal: Will not experience complications related to urinary retention Outcome: Progressing   Problem: Pain Managment: Goal: General experience of comfort will improve and/or be controlled Outcome: Progressing   Problem: Safety: Goal: Ability to remain free from injury will improve Outcome: Progressing   Problem: Skin Integrity: Goal: Risk for impaired skin integrity will decrease Outcome: Progressing

## 2023-09-26 NOTE — Progress Notes (Addendum)
 NAME:  Jessica Conley, MRN:  [id number], DOB:  Aug 13, 1945, LOS: 1 ADMISSION DATE:  09/25/2023, CONSULTATION DATE:  09/25/23 REFERRING MD:  Dr. Lockie Mola ER, CHIEF COMPLAINT:  Abdominal pain   History of Present Illness:  78 year old female patient with history of HTN, T2DM, HLD, recent negative coronary angiogram presents now on 09/25/2023 to [facility] emergency room chief complaint of 7 out of 10 abdominal pain, associated with a headache and fatigue, increasing, generalized sensation, some burning, progresses well that she sought medical attention. She denies nausea and vomiting. She has some generalized pain and arthritic symptoms taking prednisone.   In the emergency room, patient was found to have sodium of 116, bicarb of 16, anion gap slightly elevated. CT abdomen pelvis negative for acute pathology, CT head also negative. She has noted to take diuretics at home.  Initially patient was planned to be admitted to the hospitalist service; however, given low sodium and concern for acute decline she will not be admitted to the ICU.  Pertinent  Medical History   has a past medical history of Celiac sprue, Hemochromatosis, hereditary (HCC) (05/18/2010), and Hypertension.   Significant Hospital Events: Including procedures, antibiotic start and stop dates in addition to other pertinent events   2/25 admit for hyponatremia to ICU   Interim History / Subjective:  Alert and oriented. States abdominal pain improved. Noted abd pain started after taking prednisone, tramadol for chronic pains. Poor appetite due to pain but still taking her home BP meds. Able to tolerate/eat breakfast this AM. Denies n/v or other acute pain.   Objective   Blood pressure 115/75, pulse 62, temperature 97.7 F (36.5 C), temperature source Oral, resp. rate 16, height 5\' 8"  (1.727 m), weight 67.4 kg, SpO2 96%.        Intake/Output Summary (Last 24 hours) at 09/26/2023 4010 Last data filed at 09/26/2023 0500 Gross per  24 hour  Intake 1204.8 ml  Output 200 ml  Net 1004.8 ml   Filed Weights   09/25/23 1412 09/25/23 2200 09/26/23 0400  Weight: 68 kg 67.2 kg 67.4 kg    Examination: General: alert, laying in bed comfortably, in no acute distress HENT: [place]/AT, dry mucous membranes  Lungs: normal effort on room air, CTAB Cardiovascular: RRR Abdomen: bowel sounds present, soft, non-distended, minimal TTP  Extremities: no LE edema Neuro: alert and oriented Skin: decreased skin turgor appears hypovolemic   Resolved Hospital Problem list     Assessment & Plan:  Hypovolemic Hyponatremia Na 116. UA unremarkable. Urine osm and Na supportive of hyponatremia likely 2/2 hydrochlorothiazide. S/p 1L NS bolus and on NS infusion.  -abdominal pain resolved, no other associated symptoms, feeling improved -Na improved to 120  -decreased NS to 20 cc/hr -trend BMP Q6H  -Na correction of no more than 6-8, goal 124 -tolerating po intake -stable for transfer off of ICU   HTN -BP stable -holding home losartan-hydrochlorothiazide, consider d/c HCTZ -continue home amlodipine and metoprolol   HLD -continue home pravastatin   T2DM -hold home metformin  -AM CBG within goal  -start SSI if indicated   Best Practice (right click and "Reselect all SmartList Selections" daily)   Diet/type: Regular consistency (see orders) DVT prophylaxis LMWH Pressure ulcer(s): N/A GI prophylaxis: H2B Lines: N/A Foley:  N/A Code Status:  full code Last date of multidisciplinary goals of care discussion [pending]  Labs   CBC: Recent Labs  Lab 09/25/23 1421 09/25/23 1821 09/25/23 2004 09/26/23 0243  WBC 8.7  --  9.6 9.9  HGB 15.5* 15.0 14.3 14.6  HCT 41.5 44.0 38.9 RESULTS UNAVAILABLE DUE TO INTERFERING SUBSTANCE  MCV 92.4  --  93.1 RESULTS UNAVAILABLE DUE TO INTERFERING SUBSTANCE  PLT 378  --  328 317    Basic Metabolic Panel: Recent Labs  Lab 09/25/23 1421 09/25/23 1821 09/25/23 2004 09/26/23 0243  NA 116*  116* 119* 120*  K 3.0* 3.0* 2.9* 3.5  CL 82*  --  85* 88*  CO2 16*  --  20* 21*  GLUCOSE 135*  --  114* 126*  BUN 12  --  10 9  CREATININE 0.61  --  0.56 0.55  CALCIUM 9.2  --  8.7* 8.5*  MG  --   --   --  1.9  PHOS  --   --   --  2.5   GFR: Estimated Creatinine Clearance: 57.5 mL/min (by C-G formula based on SCr of 0.55 mg/dL). Recent Labs  Lab 09/25/23 1421 09/25/23 2004 09/26/23 0243  WBC 8.7 9.6 9.9    Liver Function Tests: Recent Labs  Lab 09/25/23 1421  AST 30  ALT 34  ALKPHOS 37*  BILITOT 1.1  PROT 7.5  ALBUMIN 4.2   Recent Labs  Lab 09/25/23 1421  LIPASE 39   No results for input(s): "AMMONIA" in the last 168 hours.  ABG    Component Value Date/Time   HCO3 24.7 09/25/2023 1821   TCO2 26 09/25/2023 1821   O2SAT 64 09/25/2023 1821     Coagulation Profile: No results for input(s): "INR", "PROTIME" in the last 168 hours.  Cardiac Enzymes: No results for input(s): "CKTOTAL", "CKMB", "CKMBINDEX", "TROPONINI" in the last 168 hours.  HbA1C: No results found for: "HGBA1C"  CBG: No results for input(s): "GLUCAP" in the last 168 hours.  Review of Systems:   Denies n/v, abdominal pain, confusion, headache   Past Medical History:  She,  has a past medical history of Celiac sprue, Hemochromatosis, hereditary (HCC) (05/18/2010), and Hypertension.   Surgical History:   Past Surgical History:  Procedure Laterality Date   EYE SURGERY     LEFT HEART CATH AND CORONARY ANGIOGRAPHY N/A 08/01/2022   Procedure: LEFT HEART CATH AND CORONARY ANGIOGRAPHY;  Surgeon: Elder Negus, MD;  Location: [facility] INVASIVE CV LAB;  Service: Cardiovascular;  Laterality: N/A;   PHLEBOTOMY THERAPEUTIC  08/21/2011       VAGINAL HYSTERECTOMY  early age 78s   prolapse     Social History:   reports that she has never smoked. She has never used smokeless tobacco. She reports current alcohol use of about 4.0 standard drinks of alcohol per week. She reports that she does not use  drugs.   Family History:  Her family history includes Breast cancer (age of onset: 75) in her daughter; Heart attack in her mother; Melanoma in her father.   Allergies Allergies  Allergen Reactions   Wheat     ciliac disease   Penicillins Rash    Waterblisters      Home Medications  Prior to Admission medications   Medication Sig Start Date End Date Taking? Authorizing Provider  amLODipine (NORVASC) 2.5 MG tablet Take 2.5 mg by mouth daily.   Yes [provider]  Cholecalciferol (VITAMIN D-3) 125 MCG (5000 UT) TABS Take 5,000 Units by mouth daily as needed (When she remembers to take it).   Yes [provider]  losartan-hydrochlorothiazide (HYZAAR) 100-25 MG per tablet Take 1 tablet by mouth daily.   Yes Pearson Grippe, MD  metFORMIN (GLUCOPHAGE)  500 MG tablet Take 500 mg by mouth daily.   Yes [provider]  metoprolol succinate (TOPROL-XL) 50 MG 24 hr tablet TAKE 1 TABLET(50 MG) BY MOUTH DAILY WITH OR IMMEDIATELY FOLLOWING A MEAL Patient taking differently: Take 25 mg by mouth daily. 01/23/23  Yes Patwardhan, Manish J, MD  omeprazole (PRILOSEC) 40 MG capsule Take 40 mg by mouth daily. 10/10/19  Yes [provider]  Polyethyl Glycol-Propyl Glycol (SYSTANE) 0.4-0.3 % SOLN Place 1 drop into both eyes every morning.   Yes [provider]  pravastatin (PRAVACHOL) 40 MG tablet Take 40 mg by mouth daily. Every other day   Yes [provider]  traMADol (ULTRAM) 50 MG tablet Take 50 mg by mouth every 6 (six) hours as needed for moderate pain (pain score 4-6) or severe pain (pain score 7-10). 09/18/23  Yes [provider]  vitamin B-12 (CYANOCOBALAMIN) 100 MCG tablet Take 100 mcg by mouth 2 (two) times a week.   Yes [provider]  predniSONE (DELTASONE) 10 MG tablet Take 10 mg by mouth See admin instructions. 2 tablets in the am with food for 3 days then 1 for 4 days Orally Once a day for 7 days Patient not taking: Reported on  09/25/2023 09/12/23   [provider]     Critical care time:      Rana Snare, DO Internal Medicine Resident PGY-2

## 2023-09-26 NOTE — Plan of Care (Signed)

## 2023-09-26 NOTE — Hospital Course (Signed)
 Transfer.  Just losartan

## 2023-09-27 ENCOUNTER — Ambulatory Visit (HOSPITAL_COMMUNITY): Payer: Medicare Other

## 2023-09-27 DIAGNOSIS — I10 Essential (primary) hypertension: Secondary | ICD-10-CM | POA: Diagnosis not present

## 2023-09-27 DIAGNOSIS — E871 Hypo-osmolality and hyponatremia: Secondary | ICD-10-CM | POA: Diagnosis not present

## 2023-09-27 DIAGNOSIS — E119 Type 2 diabetes mellitus without complications: Secondary | ICD-10-CM | POA: Diagnosis not present

## 2023-09-27 DIAGNOSIS — E782 Mixed hyperlipidemia: Secondary | ICD-10-CM

## 2023-09-27 LAB — BASIC METABOLIC PANEL
Anion gap: 12 (ref 5–15)
Anion gap: 12 (ref 5–15)
Anion gap: 14 (ref 5–15)
BUN: 15 mg/dL (ref 8–23)
BUN: 15 mg/dL (ref 8–23)
BUN: 18 mg/dL (ref 8–23)
CO2: 21 mmol/L — ABNORMAL LOW (ref 22–32)
CO2: 23 mmol/L (ref 22–32)
CO2: 20 mmol/L — ABNORMAL LOW (ref 22–32)
Calcium: 9 mg/dL (ref 8.9–10.3)
Calcium: 9 mg/dL (ref 8.9–10.3)
Calcium: 9 mg/dL (ref 8.9–10.3)
Chloride: 91 mmol/L — ABNORMAL LOW (ref 98–111)
Chloride: 91 mmol/L — ABNORMAL LOW (ref 98–111)
Chloride: 92 mmol/L — ABNORMAL LOW (ref 98–111)
Creatinine, Ser: 0.61 mg/dL (ref 0.44–1.00)
Creatinine, Ser: 0.68 mg/dL (ref 0.44–1.00)
Creatinine, Ser: 0.73 mg/dL (ref 0.44–1.00)
GFR, Estimated: >60 mL/min (ref >60)
GFR, Estimated: >60 mL/min (ref >60)
GFR, Estimated: >60 mL/min (ref >60)
Glucose, Bld: 121 mg/dL — ABNORMAL HIGH (ref 70–99)
Glucose, Bld: 147 mg/dL — ABNORMAL HIGH (ref 70–99)
Glucose, Bld: 169 mg/dL — ABNORMAL HIGH (ref 70–99)
Potassium: 3.4 mmol/L — ABNORMAL LOW (ref 3.5–5.1)
Potassium: 3.5 mmol/L (ref 3.5–5.1)
Potassium: 3.4 mmol/L — ABNORMAL LOW (ref 3.5–5.1)
Sodium: 124 mmol/L — ABNORMAL LOW (ref 135–145)
Sodium: 126 mmol/L — ABNORMAL LOW (ref 135–145)
Sodium: 126 mmol/L — ABNORMAL LOW (ref 135–145)

## 2023-09-27 LAB — CBC
HCT: 39 % (ref 36.0–46.0)
Hemoglobin: 14.4 g/dL (ref 12.0–15.0)
MCH: 34 pg (ref 26.0–34.0)
MCHC: 36.9 g/dL — ABNORMAL HIGH (ref 30.0–36.0)
MCV: 92 fL (ref 80.0–100.0)
Platelets: 304 10*3/uL (ref 150–400)
RBC: 4.24 MIL/uL (ref 3.87–5.11)
RDW: 11.5 % (ref 11.5–15.5)
WBC: 7.5 10*3/uL (ref 4.0–10.5)
nRBC: 0 % (ref 0.0–0.2)

## 2023-09-27 LAB — TSH: TSH: 0.945 u[IU]/mL (ref 0.350–4.500)

## 2023-09-27 MED ORDER — POTASSIUM CHLORIDE CRYS ER 20 MEQ PO TBCR
40.0000 meq | EXTENDED_RELEASE_TABLET | Freq: Once | ORAL | Status: AC
  Administered 2023-09-27: 40 meq via ORAL
  Filled 2023-09-27: qty 2

## 2023-09-27 NOTE — Assessment & Plan Note (Addendum)
 09-27-2023 on Toprol-XL 25 mg every day and norvasc 2.5 mg qday . Do not restart hydrochlorothiazide.  09-28-2023 BP controlled on Toprol-XL and norvasc.  09-29-2023 stop ARB/hydrochlorothiazide at discharge. Continue with Toprol-XL and norvasc at discharge. Pt to f/u with PCP regarding HTN management.

## 2023-09-27 NOTE — Assessment & Plan Note (Addendum)
 09-27-2023 continue with pravastatin.  09-28-2023 stable.  09-29-2023 continue pravastatin at discharge.

## 2023-09-27 NOTE — Subjective & Objective (Signed)
 Pt seen and examined. Pt received po ultram this AM around 6 AM. RN reports pt very sleeping and drowsy. Met with pt and husband at bedside. Pt having left buttock pain with radiation down her left buttock and into her left hamstring.  Has outpatient MRI left hip ordered.  Na of 128 this AM.

## 2023-09-27 NOTE — Plan of Care (Signed)
 Pt alert x4, independent, room air, monitoring sodium,

## 2023-09-27 NOTE — Assessment & Plan Note (Addendum)
 09-27-2023 [facility] assumed care today. 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  Na up to 126. Pt able to ambulate well over 150 feet without any assistance. Would like her serum Na to be >=130 prior to discharge. Will hold hydrochlorothiazide at discharge. Pt need to f/u with PCP next week for repeat BMP. Pt will need alternative HTN med besides hydrochlorothiazide when she sees her PCP.  Pt states she normally walks about 3 miles per day. She appears much younger than stated age of 26. Check TSH.  09-27-2022 TSH normal. Na slowly improving. Na still 128. Will start salt tabs 1 g bid. Repeat BMP in AM. Need her Na to be at least 130 prior to discharge.  09-29-2023 Na up to 130. Pt to eat a liberal salt diet this weekend. Stop ARB/hydrochlorothiazide at discharge. Pt to have repeat Na level drawn at PCP office next week. Pt able to ambulate hallways without difficulty per husband.

## 2023-09-27 NOTE — Progress Notes (Signed)
 PROGRESS NOTE    Jessica Conley  WUJ:[id number] DOB: [birth year] DOA: 09/25/2023 PCP: Irena Reichmann, DO  Subjective: Pt seen and examined. Pt feeling better. Much less weak than on admission. Able to walk around nursing unit twice(in room 2W-21) without any supportive device. No balance issues.  Na up to 126 this AM. IVF stopped.   Hospital Course: HPI: 78 year old female patient with history of hypertension, type 2 diabetes mellitus, hyperlipidemia, recent negative coronary angiogram presents now on 09/25/2023 to [facility] emergency room chief complaint of 7 out of 10 abdominal pain, associated with a headache and fatigue, increasing, generalized sensation, some burning, progresses well that she sought medical attention. She denies nausea and vomiting. She has some generalized pain and arthritic symptoms taking prednisone.,  Also admits to headache as above. In the emergency room, patient was found to have sodium of 116, bicarb of 16, anion gap slightly elevated. CT abdomen pelvis negative for acute pathology, CT head also negative. She has noted to take diuretics at home.  Initially patient was planned to be admitted to the hospitalist service; however, given low sodium and concern for acute decline she will be admitted to the ICU.  Significant Events: Admitted 09/25/2023 to PCCM service for acute hyponatremia 09-26-2023 given IVF with NS. Pt did not receive 3% saline.  Na improved to 121 09-27-2023 pt's care transferred to [facility]  Significant Labs: WBC 8.7, HgB 15.5, Plt 378 Na 116, K 3.0, Cl 82, CO2 of 16, BUN 12, Scr 0.61 Urine Osm 442 Urine Na 75 Serum Osm 244  Significant Imaging Studies: CT abd/pelvis No acute findings in the abdomen pelvis. 2. Hepatic steatosis. 3. Normal gallbladder and biliary tree. 4. Normal appendix. 5. Post hysterectomy. CT head No acute intracranial abnormality.   Antibiotic Therapy: Anti-infectives (From admission, onward)    None        Procedures:   Consultants: PCCM    Assessment and Plan: * Hyponatremia 09-27-2023 TRH assumed care today. 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  Na up to 126. Pt able to ambulate well over 150 feet without any assistance. Would like her serum Na to be >=130 prior to discharge. Will hold hydrochlorothiazide at discharge. Pt need to f/u with PCP next week for repeat BMP. Pt will need alternative HTN med besides hydrochlorothiazide when she sees her PCP.  Pt states she normally walks about 3 miles per day. She appears much younger than stated age of 78. Check TSH.  Mixed hyperlipidemia 09-27-2023 continue with pravastatin.  Essential hypertension 09-27-2023 on Toprol-XL 25 mg every day and norvasc 2.5 mg qday . Do not restart hydrochlorothiazide.  Type 2 diabetes mellitus without complication, without long-term current use of insulin (HCC) 09-27-2023 stable. Pt can restart metformin at discharge.  DVT prophylaxis: SCDs Start: 09/25/23 2002    Code Status: Full Code Family Communication: no family at bedside. Pt is decisional. Disposition Plan: return home Reason for continuing need for hospitalization: monitoring Na.  Objective: Vitals:   09/26/23 1851 09/26/23 2045 09/27/23 0501 09/27/23 0723  BP: (!) 155/81 (!) 145/87 (!) 158/90 (!) 145/87  Pulse: 65 63 63 63  Resp: 16 18 18 18   Temp:  98.1 F (36.7 C) 97.8 F (36.6 C) 97.8 F (36.6 C)  TempSrc:  Oral Oral Oral  SpO2: 99% 98% 98% 96%  Weight:   67.2 kg   Height:        Intake/Output Summary (Last 24 hours) at 09/27/2023 1407 Last data filed at 09/27/2023 [phone]  Gross per 24 hour  Intake 765.73 ml  Output --  Net 765.73 ml   Filed Weights   09/25/23 2200 09/26/23 0400 09/27/23 0501  Weight: 67.2 kg 67.4 kg 67.2 kg    Examination:  Physical Exam Vitals and nursing note reviewed.  Constitutional:      General: She is not in acute distress.    Appearance: Normal appearance. She is normal weight. She is not  ill-appearing, toxic-appearing or diaphoretic.     Comments: Appears younger than stated age of 78  HENT:     Head: Normocephalic and atraumatic.     Nose: Nose normal.  Eyes:     General: No scleral icterus. Cardiovascular:     Rate and Rhythm: Normal rate and regular rhythm.  Pulmonary:     Effort: Pulmonary effort is normal.     Breath sounds: Normal breath sounds.  Abdominal:     General: Abdomen is flat. Bowel sounds are normal.     Palpations: Abdomen is soft.  Musculoskeletal:     Right lower leg: No edema.     Left lower leg: No edema.  Skin:    General: Skin is warm and dry.     Capillary Refill: Capillary refill takes less than 2 seconds.  Neurological:     General: No focal deficit present.     Mental Status: She is alert and oriented to person, place, and time.    Data Reviewed: I have personally reviewed following labs and imaging studies  CBC: Recent Labs  Lab 09/25/23 1421 09/25/23 1821 09/25/23 2004 09/26/23 0243 09/27/23 0154  WBC 8.7  --  9.6 9.9 7.5  HGB 15.5* 15.0 14.3 14.6 14.4  HCT 41.5 44.0 38.9 RESULTS UNAVAILABLE DUE TO INTERFERING SUBSTANCE 39.0  MCV 92.4  --  93.1 RESULTS UNAVAILABLE DUE TO INTERFERING SUBSTANCE 92.0  PLT 378  --  328 317 304   Basic Metabolic Panel: Recent Labs  Lab 09/26/23 0243 09/26/23 1356 09/26/23 1940 09/27/23 0154 09/27/23 0752  NA 120* 121* 123* 124* 126*  K 3.5 3.5 3.8 3.4* 3.5  CL 88* 90* 88* 91* 91*  CO2 21* 19* 23 21* 23  GLUCOSE 126* 134* 98 121* 147*  BUN 9 11 12 15 15   CREATININE 0.55 0.82 0.69 0.61 0.68  CALCIUM 8.5* 9.1 9.2 9.0 9.0  MG 1.9  --   --   --   --   PHOS 2.5  --   --   --   --    GFR: Estimated Creatinine Clearance: 57.5 mL/min (by C-G formula based on SCr of 0.68 mg/dL). Liver Function Tests: Recent Labs  Lab 09/25/23 1421  AST 30  ALT 34  ALKPHOS 37*  BILITOT 1.1  PROT 7.5  ALBUMIN 4.2   Recent Labs  Lab 09/25/23 1421  LIPASE 39   CBG: Recent Labs  Lab 09/25/23 2142  09/26/23 0722  GLUCAP 113* 133*   Recent Results (from the past 240 hours)  Resp panel by RT-PCR (RSV, Flu A&B, Covid) Anterior Nasal Swab     Status: None   Collection Time: 09/25/23  2:15 PM   Specimen: Anterior Nasal Swab  Result Value Ref Range Status   SARS Coronavirus 2 by RT PCR NEGATIVE NEGATIVE Final   Influenza A by PCR NEGATIVE NEGATIVE Final   Influenza B by PCR NEGATIVE NEGATIVE Final    Comment: (NOTE) The Xpert Xpress SARS-CoV-2/FLU/RSV plus assay is intended as an aid in the diagnosis of influenza from Nasopharyngeal swab specimens  and should not be used as a sole basis for treatment. Nasal washings and aspirates are unacceptable for Xpert Xpress SARS-CoV-2/FLU/RSV testing.  Fact Sheet for Patients: BloggerCourse.com  Fact Sheet for Healthcare Providers: SeriousBroker.it  This test is not yet approved or cleared by the [place] FDA and has been authorized for detection and/or diagnosis of SARS-CoV-2 by FDA under an Emergency Use Authorization (EUA). This EUA will remain in effect (meaning this test can be used) for the duration of the COVID-19 declaration under Section 564(b)(1) of the Act, 21 U.S.C. section 360bbb-3(b)(1), unless the authorization is terminated or revoked.     Resp Syncytial Virus by PCR NEGATIVE NEGATIVE Final    Comment: (NOTE) Fact Sheet for Patients: BloggerCourse.com  Fact Sheet for Healthcare Providers: SeriousBroker.it  This test is not yet approved or cleared by the [place] FDA and has been authorized for detection and/or diagnosis of SARS-CoV-2 by FDA under an Emergency Use Authorization (EUA). This EUA will remain in effect (meaning this test can be used) for the duration of the COVID-19 declaration under Section 564(b)(1) of the Act, 21 U.S.C. section 360bbb-3(b)(1), unless the authorization is terminated  or revoked.  Performed at [facility], [address], [place], [place] [address]   MRSA Next Gen by PCR, Nasal     Status: None   Collection Time: 09/25/23  9:44 PM   Specimen: Nasal Mucosa; Nasal Swab  Result Value Ref Range Status   MRSA by PCR Next Gen NOT DETECTED NOT DETECTED Final    Comment: (NOTE) The GeneXpert MRSA Assay (FDA approved for NASAL specimens only), is one component of a comprehensive MRSA colonization surveillance program. It is not intended to diagnose MRSA infection nor to guide or monitor treatment for MRSA infections. Test performance is not FDA approved in patients less than 7 years old. Performed at [facility], [address], [place], [place] [address]      Radiology Studies: CT ABDOMEN PELVIS W CONTRAST Result Date: 09/25/2023 CLINICAL DATA:  Abdominal pain.  Headache. EXAM: CT ABDOMEN AND PELVIS WITH CONTRAST TECHNIQUE: Multidetector CT imaging of the abdomen and pelvis was performed using the standard protocol following bolus administration of intravenous contrast. RADIATION DOSE REDUCTION: This exam was performed according to the departmental dose-optimization program which includes automated exposure control, adjustment of the mA and/or kV according to patient size and/or use of iterative reconstruction technique. CONTRAST:  75mL OMNIPAQUE IOHEXOL 350 MG/ML SOLN COMPARISON:  Abdominal MRI 02/04/2019, CT chest 03/24/2019 FINDINGS: Lower chest: Nodular density in the RIGHT middle lobe on the first slice (image 1/5) corresponds to a benign vascular structure on comparison CT. Mild basilar atelectasis. Hepatobiliary: Round lesion the posterior RIGHT hepatic lobe measuring 11 mm corresponds to a benign cyst on comparison MRI. Delayed phase imaging demonstrates low-attenuation liver suggesting hepatic steatosis. Findings are also confirmed on comparison MRI. Gallbladder is normal. Common bile duct normal. Pancreas: Pancreas is normal. No ductal  dilatation. No pancreatic inflammation. Spleen: Normal spleen Adrenals/urinary tract: Adrenal glands and kidneys are normal. The ureters and bladder normal. Stomach/Bowel: Stomach, small bowel, appendix, and cecum are normal. The colon and rectosigmoid colon are normal. Vascular/Lymphatic: Abdominal aorta is normal caliber. No periportal or retroperitoneal adenopathy. No pelvic adenopathy. Reproductive: Post hysterectomy. Nodular soft tissue thickening in the pelvic floor anterior to the base the bladder appears benign. Potential postsurgical ( sagittal image 114/series 7). Other: No free fluid. Musculoskeletal: No aggressive osseous lesion. IMPRESSION: 1. No acute findings in the abdomen  pelvis. 2. Hepatic steatosis. 3. Normal gallbladder and biliary tree. 4. Normal appendix. 5. Post hysterectomy. Electronically Signed   By: Genevive Bi M.D.   On: 09/25/2023 18:21   CT Head Wo Contrast Result Date: 09/25/2023 CLINICAL DATA:  Headache EXAM: CT HEAD WITHOUT CONTRAST TECHNIQUE: Contiguous axial images were obtained from the base of the skull through the vertex without intravenous contrast. RADIATION DOSE REDUCTION: This exam was performed according to the departmental dose-optimization program which includes automated exposure control, adjustment of the mA and/or kV according to patient size and/or use of iterative reconstruction technique. COMPARISON:  None Available. FINDINGS: Brain: No evidence of acute infarction, hemorrhage, hydrocephalus, extra-axial collection or mass lesion/mass effect. Vascular: No hyperdense vessel or unexpected calcification. Skull: Normal. Negative for fracture or focal lesion. Sinuses/Orbits: No acute finding. Other: None. IMPRESSION: No acute intracranial abnormality. Electronically Signed   By: Darliss Cheney M.D.   On: 09/25/2023 17:59    Scheduled Meds:  amLODipine  2.5 mg Oral Daily   Chlorhexidine Gluconate Cloth  6 each Topical Daily   diclofenac Sodium  4 g Topical  QID   famotidine  20 mg Oral BID   metoprolol succinate  25 mg Oral Daily   pravastatin  40 mg Oral Daily   Continuous Infusions:   LOS: 2 days   Time spent: 40 minutes  [name], DO  [facility]  09/27/2023, 2:07 PM

## 2023-09-27 NOTE — Assessment & Plan Note (Addendum)
 09-27-2023 stable. Pt can restart metformin at discharge.  09-28-2023 stable.  09-29-2023 stable. Restart metformin.

## 2023-09-27 NOTE — Hospital Course (Addendum)
 HPI: 78 year old female patient with history of hypertension, type 2 diabetes mellitus, hyperlipidemia, recent negative coronary angiogram presents now on 09/25/2023 to [facility] emergency room chief complaint of 7 out of 10 abdominal pain, associated with a headache and fatigue, increasing, generalized sensation, some burning, progresses well that she sought medical attention. She denies nausea and vomiting. She has some generalized pain and arthritic symptoms taking prednisone.,  Also admits to headache as above. In the emergency room, patient was found to have sodium of 116, bicarb of 16, anion gap slightly elevated. CT abdomen pelvis negative for acute pathology, CT head also negative. She has noted to take diuretics at home.  Initially patient was planned to be admitted to the hospitalist service; however, given low sodium and concern for acute decline she will be admitted to the ICU.  Significant Events: Admitted 09/25/2023 to PCCM service for acute hyponatremia 09-26-2023 given IVF with NS. Pt did not receive 3% saline.  Na improved to 121 09-27-2023 pt's care transferred to [facility]  Significant Labs: WBC 8.7, HgB 15.5, Plt 378 Na 116, K 3.0, Cl 82, CO2 of 16, BUN 12, Scr 0.61 Urine Osm 442 Urine Na 75 Serum Osm 244  Significant Imaging Studies: CT abd/pelvis No acute findings in the abdomen pelvis. 2. Hepatic steatosis. 3. Normal gallbladder and biliary tree. 4. Normal appendix. 5. Post hysterectomy. CT head No acute intracranial abnormality.   Antibiotic Therapy: Anti-infectives (From admission, onward)    None       Procedures:   Consultants: PCCM

## 2023-09-28 ENCOUNTER — Inpatient Hospital Stay (HOSPITAL_COMMUNITY): Payer: Medicare Other

## 2023-09-28 DIAGNOSIS — M25552 Pain in left hip: Secondary | ICD-10-CM

## 2023-09-28 DIAGNOSIS — E871 Hypo-osmolality and hyponatremia: Secondary | ICD-10-CM | POA: Diagnosis not present

## 2023-09-28 DIAGNOSIS — I10 Essential (primary) hypertension: Secondary | ICD-10-CM | POA: Diagnosis not present

## 2023-09-28 DIAGNOSIS — E782 Mixed hyperlipidemia: Secondary | ICD-10-CM | POA: Diagnosis not present

## 2023-09-28 LAB — COMPREHENSIVE METABOLIC PANEL
ALT: 36 U/L (ref 0–44)
AST: 23 U/L (ref 15–41)
Albumin: 3.6 g/dL (ref 3.5–5.0)
Alkaline Phosphatase: 32 U/L — ABNORMAL LOW (ref 38–126)
Anion gap: 10 (ref 5–15)
BUN: 17 mg/dL (ref 8–23)
CO2: 20 mmol/L — ABNORMAL LOW (ref 22–32)
Calcium: 8.7 mg/dL — ABNORMAL LOW (ref 8.9–10.3)
Chloride: 98 mmol/L (ref 98–111)
Creatinine, Ser: 0.65 mg/dL (ref 0.44–1.00)
GFR, Estimated: >60 mL/min (ref >60)
Glucose, Bld: 127 mg/dL — ABNORMAL HIGH (ref 70–99)
Potassium: 4.3 mmol/L (ref 3.5–5.1)
Sodium: 128 mmol/L — ABNORMAL LOW (ref 135–145)
Total Bilirubin: 1 mg/dL (ref 0.0–1.2)
Total Protein: 6.8 g/dL (ref 6.5–8.1)

## 2023-09-28 MED ORDER — ACETAMINOPHEN 500 MG PO TABS
1000.0000 mg | ORAL_TABLET | Freq: Four times a day (QID) | ORAL | Status: DC
  Administered 2023-09-28 – 2023-09-29 (×4): 1000 mg via ORAL
  Filled 2023-09-28 (×4): qty 2

## 2023-09-28 MED ORDER — SODIUM CHLORIDE 1 G PO TABS
1.0000 g | ORAL_TABLET | Freq: Two times a day (BID) | ORAL | Status: DC
  Administered 2023-09-28 – 2023-09-29 (×3): 1 g via ORAL
  Filled 2023-09-28 (×3): qty 1

## 2023-09-28 NOTE — Assessment & Plan Note (Addendum)
 09-28-2023 continues to have radicular type pain down her left buttock and radiating into her left hamstring/left knee. Outpatient MRI left hip ordered by ortho. Will get MRI Lumbar spine and left hip. Stop ultram. Start scheduled tylenol.  09-29-2023 MRI left hip and MRI lumbar spine performed yesterday. Minimal degenerative changes to hips bilaterally. I think her left hip pain/knee pain  is referred pain from her lumbar spine.  MRI lumbar spine shows "Multilevel lumbar spondylosis with resultant mild bilateral L2 through L4 foraminal stenosis, with mild to moderate right L5 foraminal narrowing. No significant spinal stenosis within the lumbar spine. 2. Mild chronic compression deformity at the superior endplate of T12."  I think she would benefit from [facility]. Pt wants referral to [facility] at discharge. Continue with tylenol 1000 mg qid.

## 2023-09-28 NOTE — Care Management Important Message (Signed)
 Important Message  Patient Details  Name: Jessica Conley MRN: [id number] Date of Birth: 1946/07/22   Important Message Given:  Yes - Medicare IM     Dorena Bodo 09/28/2023, 3:26 PM

## 2023-09-28 NOTE — Plan of Care (Signed)

## 2023-09-28 NOTE — Progress Notes (Signed)
 PROGRESS NOTE    Jessica Conley  ZOX:[id number] DOB: 1945-09-23 DOA: 09/25/2023 PCP: Irena Reichmann, DO  Subjective: Pt seen and examined. Pt received po ultram this AM around 6 AM. RN reports pt very sleeping and drowsy. Met with pt and husband at bedside. Pt having left buttock pain with radiation down her left buttock and into her left hamstring.  Has outpatient MRI left hip ordered.  Na of 128 this AM.   Hospital Course: HPI: 78 year old female patient with history of hypertension, type 2 diabetes mellitus, hyperlipidemia, recent negative coronary angiogram presents now on 09/25/2023 to [facility] emergency room chief complaint of 7 out of 10 abdominal pain, associated with a headache and fatigue, increasing, generalized sensation, some burning, progresses well that she sought medical attention. She denies nausea and vomiting. She has some generalized pain and arthritic symptoms taking prednisone.,  Also admits to headache as above. In the emergency room, patient was found to have sodium of 116, bicarb of 16, anion gap slightly elevated. CT abdomen pelvis negative for acute pathology, CT head also negative. She has noted to take diuretics at home.  Initially patient was planned to be admitted to the hospitalist service; however, given low sodium and concern for acute decline she will be admitted to the ICU.  Significant Events: Admitted 09/25/2023 to PCCM service for acute hyponatremia 09-26-2023 given IVF with NS. Pt did not receive 3% saline.  Na improved to 121 09-27-2023 pt's care transferred to [facility]  Significant Labs: WBC 8.7, HgB 15.5, Plt 378 Na 116, K 3.0, Cl 82, CO2 of 16, BUN 12, Scr 0.61 Urine Osm 442 Urine Na 75 Serum Osm 244  Significant Imaging Studies: CT abd/pelvis No acute findings in the abdomen pelvis. 2. Hepatic steatosis. 3. Normal gallbladder and biliary tree. 4. Normal appendix. 5. Post hysterectomy. CT head No acute intracranial abnormality.    Antibiotic Therapy: Anti-infectives (From admission, onward)    None       Procedures:   Consultants: PCCM    Assessment and Plan: * Hyponatremia 09-27-2023 TRH assumed care today. 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  Na up to 126. Pt able to ambulate well over 150 feet without any assistance. Would like her serum Na to be >=130 prior to discharge. Will hold hydrochlorothiazide at discharge. Pt need to f/u with PCP next week for repeat BMP. Pt will need alternative HTN med besides hydrochlorothiazide when she sees her PCP.  Pt states she normally walks about 3 miles per day. She appears much younger than stated age of 80. Check TSH.  09-27-2022 TSH normal. Na slowly improving. Na still 128. Will start salt tabs 1 g bid. Repeat BMP in AM. Need her Na to be at least 130 prior to discharge.  Pain of left hip joint 09-28-2023 continues to have radicular type pain down her left buttock and radiating into her left hamstring/left knee. Outpatient MRI left hip ordered by ortho. Will get MRI Lumbar spine and left hip. Stop ultram. Start scheduled tylenol.  Mixed hyperlipidemia 09-27-2023 continue with pravastatin.  09-28-2023 stable.  Essential hypertension 09-27-2023 on Toprol-XL 25 mg every day and norvasc 2.5 mg qday . Do not restart hydrochlorothiazide.  09-28-2023 BP controlled on Toprol-XL and norvasc.  Type 2 diabetes mellitus without complication, without long-term current use of insulin (HCC) 09-27-2023 stable. Pt can restart metformin at discharge.  09-28-2023 stable.   DVT prophylaxis: SCDs Start: 09/25/23 2002    Code Status: Full Code Family Communication: discussed with  pt and husband glen at bedside Disposition Plan: return home Reason for continuing need for hospitalization: Na remains low at 130. Checking MRI lumbar spine and left hip.  Objective: Vitals:   09/28/23 0636 09/28/23 0806 09/28/23 1059 09/28/23 1231  BP:  136/82  137/80  Pulse:  (!) 57  62 (!) 56  Resp:  19  19  Temp:  98.6 F (37 C)  97.9 F (36.6 C)  TempSrc:    Oral  SpO2:  96%  97%  Weight: 64.7 kg     Height:        Intake/Output Summary (Last 24 hours) at 09/28/2023 1330 Last data filed at 09/28/2023 1234 Gross per 24 hour  Intake 838 ml  Output --  Net 838 ml   Filed Weights   09/26/23 0400 09/27/23 0501 09/28/23 0636  Weight: 67.4 kg 67.2 kg 64.7 kg    Examination:  Physical Exam Vitals and nursing note reviewed.  Constitutional:      Appearance: Normal appearance.  HENT:     Head: Normocephalic and atraumatic.     Nose: Nose normal.  Eyes:     General: No scleral icterus. Cardiovascular:     Rate and Rhythm: Normal rate and regular rhythm.  Pulmonary:     Effort: Pulmonary effort is normal. No respiratory distress.     Breath sounds: Normal breath sounds.  Abdominal:     General: Abdomen is flat. Bowel sounds are normal. There is no distension.     Palpations: Abdomen is soft.  Musculoskeletal:     Comments: No hip pain with internal or external rotation of her left hip.  Skin:    General: Skin is warm and dry.     Capillary Refill: Capillary refill takes less than 2 seconds.  Neurological:     Mental Status: She is alert and oriented to person, place, and time.   Data Reviewed: I have personally reviewed following labs and imaging studies  CBC: Recent Labs  Lab 09/25/23 1421 09/25/23 1821 09/25/23 2004 09/26/23 0243 09/27/23 0154  WBC 8.7  --  9.6 9.9 7.5  HGB 15.5* 15.0 14.3 14.6 14.4  HCT 41.5 44.0 38.9 RESULTS UNAVAILABLE DUE TO INTERFERING SUBSTANCE 39.0  MCV 92.4  --  93.1 RESULTS UNAVAILABLE DUE TO INTERFERING SUBSTANCE 92.0  PLT 378  --  328 317 304   Basic Metabolic Panel: Recent Labs  Lab 09/26/23 0243 09/26/23 1356 09/26/23 1940 09/27/23 0154 09/27/23 0752 09/27/23 1417 09/28/23 0743  NA 120*   < > 123* 124* 126* 126* 128*  K 3.5   < > 3.8 3.4* 3.5 3.4* 4.3  CL 88*   < > 88* 91* 91* 92* 98  CO2 21*   <  > 23 21* 23 20* 20*  GLUCOSE 126*   < > 98 121* 147* 169* 127*  BUN 9   < > 12 15 15 18 17   CREATININE 0.55   < > 0.69 0.61 0.68 0.73 0.65  CALCIUM 8.5*   < > 9.2 9.0 9.0 9.0 8.7*  MG 1.9  --   --   --   --   --   --   PHOS 2.5  --   --   --   --   --   --    < > = values in this interval not displayed.   GFR: Estimated Creatinine Clearance: 57.5 mL/min (by C-G formula based on SCr of 0.65 mg/dL). Liver Function Tests: Recent Labs  Lab  09/25/23 1421 09/28/23 0743  AST 30 23  ALT 34 36  ALKPHOS 37* 32*  BILITOT 1.1 1.0  PROT 7.5 6.8  ALBUMIN 4.2 3.6   Recent Labs  Lab 09/25/23 1421  LIPASE 39   CBG: Recent Labs  Lab 09/25/23 2142 09/26/23 0722  GLUCAP 113* 133*   Thyroid Function Tests: Recent Labs    09/27/23 1417  TSH 0.945   Recent Results (from the past 240 hours)  Resp panel by RT-PCR (RSV, Flu A&B, Covid) Anterior Nasal Swab     Status: None   Collection Time: 09/25/23  2:15 PM   Specimen: Anterior Nasal Swab  Result Value Ref Range Status   SARS Coronavirus 2 by RT PCR NEGATIVE NEGATIVE Final   Influenza A by PCR NEGATIVE NEGATIVE Final   Influenza B by PCR NEGATIVE NEGATIVE Final    Comment: (NOTE) The Xpert Xpress SARS-CoV-2/FLU/RSV plus assay is intended as an aid in the diagnosis of influenza from Nasopharyngeal swab specimens and should not be used as a sole basis for treatment. Nasal washings and aspirates are unacceptable for Xpert Xpress SARS-CoV-2/FLU/RSV testing.  Fact Sheet for Patients: BloggerCourse.com  Fact Sheet for Healthcare Providers: SeriousBroker.it  This test is not yet approved or cleared by the [place] FDA and has been authorized for detection and/or diagnosis of SARS-CoV-2 by FDA under an Emergency Use Authorization (EUA). This EUA will remain in effect (meaning this test can be used) for the duration of the COVID-19 declaration under Section 564(b)(1) of the Act, 21  U.S.C. section 360bbb-3(b)(1), unless the authorization is terminated or revoked.     Resp Syncytial Virus by PCR NEGATIVE NEGATIVE Final    Comment: (NOTE) Fact Sheet for Patients: BloggerCourse.com  Fact Sheet for Healthcare Providers: SeriousBroker.it  This test is not yet approved or cleared by the [place] FDA and has been authorized for detection and/or diagnosis of SARS-CoV-2 by FDA under an Emergency Use Authorization (EUA). This EUA will remain in effect (meaning this test can be used) for the duration of the COVID-19 declaration under Section 564(b)(1) of the Act, 21 U.S.C. section 360bbb-3(b)(1), unless the authorization is terminated or revoked.  Performed at [facility], [address] [place], [place] [address]   MRSA Next Gen by PCR, Nasal     Status: None   Collection Time: 09/25/23  9:44 PM   Specimen: Nasal Mucosa; Nasal Swab  Result Value Ref Range Status   MRSA by PCR Next Gen NOT DETECTED NOT DETECTED Final    Comment: (NOTE) The GeneXpert MRSA Assay (FDA approved for NASAL specimens only), is one component of a comprehensive MRSA colonization surveillance program. It is not intended to diagnose MRSA infection nor to guide or monitor treatment for MRSA infections. Test performance is not FDA approved in patients less than 64 years old. Performed at [facility], [address] [place], [place] [address]      Scheduled Meds:  acetaminophen  1,000 mg Oral QID   amLODipine  2.5 mg Oral Daily   Chlorhexidine Gluconate Cloth  6 each Topical Daily   diclofenac Sodium  4 g Topical QID   famotidine  20 mg Oral BID   metoprolol succinate  25 mg Oral Daily   pravastatin  40 mg Oral Daily   sodium chloride  1 g Oral BID WC   Continuous Infusions:   LOS: 3 days   Time spent: 45 minutes  [name], DO  [facility]  09/28/2023,  1:30 PM

## 2023-09-28 NOTE — Plan of Care (Signed)
  Problem: Health Behavior/Discharge Planning: Goal: Ability to manage health-related needs will improve Outcome: Progressing   Problem: Clinical Measurements: Goal: Ability to maintain clinical measurements within normal limits will improve Outcome: Progressing   Problem: Clinical Measurements: Goal: Will remain free from infection Outcome: Progressing   Problem: Clinical Measurements: Goal: Diagnostic test results will improve Outcome: Progressing   Problem: Activity: Goal: Risk for activity intolerance will decrease Outcome: Progressing   Problem: Nutrition: Goal: Adequate nutrition will be maintained Outcome: Progressing   Problem: Pain Managment: Goal: General experience of comfort will improve and/or be controlled Outcome: Progressing

## 2023-09-29 DIAGNOSIS — M5416 Radiculopathy, lumbar region: Secondary | ICD-10-CM | POA: Diagnosis not present

## 2023-09-29 DIAGNOSIS — M25552 Pain in left hip: Secondary | ICD-10-CM | POA: Diagnosis not present

## 2023-09-29 DIAGNOSIS — I10 Essential (primary) hypertension: Secondary | ICD-10-CM | POA: Diagnosis not present

## 2023-09-29 DIAGNOSIS — E871 Hypo-osmolality and hyponatremia: Secondary | ICD-10-CM | POA: Diagnosis not present

## 2023-09-29 LAB — BASIC METABOLIC PANEL
Anion gap: 7 (ref 5–15)
BUN: 14 mg/dL (ref 8–23)
CO2: 25 mmol/L (ref 22–32)
Calcium: 8.9 mg/dL (ref 8.9–10.3)
Chloride: 98 mmol/L (ref 98–111)
Creatinine, Ser: 0.63 mg/dL (ref 0.44–1.00)
GFR, Estimated: >60 mL/min (ref >60)
Glucose, Bld: 127 mg/dL — ABNORMAL HIGH (ref 70–99)
Potassium: 4.4 mmol/L (ref 3.5–5.1)
Sodium: 130 mmol/L — ABNORMAL LOW (ref 135–145)

## 2023-09-29 LAB — MAGNESIUM: Magnesium: 2.3 mg/dL (ref 1.7–2.4)

## 2023-09-29 MED ORDER — METOPROLOL SUCCINATE ER 25 MG PO TB24
25.0000 mg | ORAL_TABLET | Freq: Every day | ORAL | 0 refills | Status: AC
Start: 2023-09-29 — End: 2023-10-29

## 2023-09-29 MED ORDER — ACETAMINOPHEN 500 MG PO TABS: 1000.0000 mg | ORAL_TABLET | Freq: Four times a day (QID) | ORAL | Status: AC

## 2023-09-29 MED ORDER — AMLODIPINE BESYLATE 2.5 MG PO TABS: 2.5000 mg | ORAL_TABLET | Freq: Every day | ORAL | 0 refills | Status: AC

## 2023-09-29 NOTE — Discharge Summary (Signed)
 [facility] Discharge Summary   Patient name: Jessica Conley  Admit date:     09/25/2023  Discharge date: 09/29/2023  Attending Physician: Madaline Brilliant [[id number]]  Discharge Physician: [name]   PCP: Irena Reichmann, DO  Admitted From: Home  Disposition:  Home  Recommendations for Outpatient Follow-up:  Follow up with PCP in 1-2 weeks Repeat BMP on Monday/Tuesday March 3 or 4, 2025 Outpatient referral to [facility] Dr. Ophelia Charter for lumbar radiculopathy  Home Health:No Equipment/Devices: None    Discharge Condition:Stable CODE STATUS:FULL Diet recommendation: Regular Fluid Restriction: None  Hospital Summary: HPI: 78 year old female patient with history of hypertension, type 2 diabetes mellitus, hyperlipidemia, recent negative coronary angiogram presents now on 09/25/2023 to [facility] emergency room chief complaint of 7 out of 10 abdominal pain, associated with a headache and fatigue, increasing, generalized sensation, some burning, progresses well that she sought medical attention. She denies nausea and vomiting. She has some generalized pain and arthritic symptoms taking prednisone.,  Also admits to headache as above. In the emergency room, patient was found to have sodium of 116, bicarb of 16, anion gap slightly elevated. CT abdomen pelvis negative for acute pathology, CT head also negative. She has noted to take diuretics at home.  Initially patient was planned to be admitted to the hospitalist service; however, given low sodium and concern for acute decline she will be admitted to the ICU.  Significant Events: Admitted 09/25/2023 to PCCM service for acute hyponatremia 09-26-2023 given IVF with NS. Pt did not receive 3% saline.  Na improved to 121 09-27-2023 pt's care transferred to [facility]  Significant Labs: WBC 8.7, HgB 15.5, Plt 378 Na 116, K 3.0, Cl 82, CO2 of 16, BUN 12, Scr 0.61 Urine Osm 442 Urine Na 75 Serum Osm 244  Significant Imaging  Studies: CT abd/pelvis No acute findings in the abdomen pelvis. 2. Hepatic steatosis. 3. Normal gallbladder and biliary tree. 4. Normal appendix. 5. Post hysterectomy. CT head No acute intracranial abnormality.   Antibiotic Therapy: Anti-infectives (From admission, onward)    None       Procedures:   Consultants: [facility] Course by Problem: * Hyponatremia 09-27-2023 TR[facility] assumed care today. 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  Na up to 126. Pt able to ambulate well over 150 feet without any assistance. Would like her serum Na to be >=130 prior to discharge. Will hold hydrochlorothiazide at discharge. Pt need to f/u with PCP next week for repeat BMP. Pt will need alternative HTN med besides hydrochlorothiazide when she sees her PCP.  Pt states she normally walks about 3 miles per day. She appears much younger than stated age of 78. Check TSH.  09-27-2022 TSH normal. Na slowly improving. Na still 128. Will start salt tabs 1 g bid. Repeat BMP in AM. Need her Na to be at least 130 prior to discharge.  09-29-2023 Na up to 130. Pt to eat a liberal salt diet this weekend. Stop ARB/hydrochlorothiazide at discharge. Pt to have repeat Na level drawn at PCP office next week. Pt able to ambulate hallways without difficulty per husband.  Left lumbar radiculopathy 09-29-2023 pt wants outpatient referral to [facility] to discuss lumbar radiculopathy. Outpatient referral made. Pt to continue 1000 mg tylenol QID.   MRI lumbar spine performed yesterday. Minimal degenerative changes to hips bilaterally. I think her left hip pain/knee pain  is referred pain from her lumbar spine.  MRI lumbar spine shows "Multilevel lumbar spondylosis with resultant  mild bilateral L2 through L4 foraminal stenosis, with mild to moderate right L5 foraminal narrowing. No significant spinal stenosis within the lumbar spine. 2. Mild chronic compression deformity at the superior endplate of T12."  I think she would  benefit from [facility].   Pain of left hip joint 09-28-2023 continues to have radicular type pain down her left buttock and radiating into her left hamstring/left knee. Outpatient MRI left hip ordered by ortho. Will get MRI Lumbar spine and left hip. Stop ultram. Start scheduled tylenol.  09-29-2023 MRI left hip and MRI lumbar spine performed yesterday. Minimal degenerative changes to hips bilaterally. I think her left hip pain/knee pain  is referred pain from her lumbar spine.  MRI lumbar spine shows "Multilevel lumbar spondylosis with resultant mild bilateral L2 through L4 foraminal stenosis, with mild to moderate right L5 foraminal narrowing. No significant spinal stenosis within the lumbar spine. 2. Mild chronic compression deformity at the superior endplate of T12."  I think she would [facility] from [facility]. Pt wants referral to [facility] at discharge. Continue with tylenol 1000 mg qid.  Mixed hyperlipidemia 09-27-2023 continue with pravastatin.  09-28-2023 stable.  09-29-2023 continue pravastatin at discharge.  Essential hypertension 09-27-2023 on Toprol-XL 25 mg every day and norvasc 2.5 mg qday . Do not restart hydrochlorothiazide.  09-28-2023 BP controlled on Toprol-XL and norvasc.  09-29-2023 stop ARB/hydrochlorothiazide at discharge. Continue with Toprol-XL and norvasc at discharge. Pt to f/u with PCP regarding HTN management.  Type 2 diabetes mellitus without complication, without long-term current use of insulin (HCC) 09-27-2023 stable. Pt can restart metformin at discharge.  09-28-2023 stable.  09-29-2023 stable. Restart metformin.    Discharge Diagnoses:  Principal Problem:   Hyponatremia Active Problems:   Pain of left hip joint   Left lumbar radiculopathy   Type 2 diabetes mellitus without complication, without long-term current use of insulin (HCC)   Essential hypertension   Mixed hyperlipidemia   Discharge Instructions  Discharge Instructions     Ambulatory  referral to Orthopedic Surgery   Complete by: As directed    Lumbar radiculopathy. May need ESI.   Call MD for:  difficulty breathing, headache or visual disturbances   Complete by: As directed    Call MD for:  extreme fatigue   Complete by: As directed    Call MD for:  hives   Complete by: As directed    Call MD for:  persistant dizziness or light-headedness   Complete by: As directed    Call MD for:  persistant nausea and vomiting   Complete by: As directed    Call MD for:  redness, tenderness, or signs of infection (pain, swelling, redness, odor or green/yellow discharge around incision site)   Complete by: As directed    Call MD for:  severe uncontrolled pain   Complete by: As directed    Call MD for:  temperature >100.4   Complete by: As directed    Diet gluten free   Complete by: As directed    Discharge instructions   Complete by: As directed    1. Follow up with primary care provider in 1-2 weeks. 2. Have bloodwork drawn at PCP office this Monday(March 3) or Tuesday (march 4) to check sodium level 3. Outpatient referral has been made to [facility] for your back pain.   Increase activity slowly   Complete by: As directed       Allergies as of 09/29/2023       Reactions   Wheat  ciliac disease   Penicillins Rash   Waterblisters         Medication List     STOP taking these medications    losartan-hydrochlorothiazide 100-25 MG tablet Commonly known as: HYZAAR   predniSONE 10 MG tablet Commonly known as: DELTASONE       TAKE these medications    acetaminophen 500 MG tablet Commonly known as: TYLENOL Take 2 tablets (1,000 mg total) by mouth 4 (four) times daily.   amLODipine 2.5 MG tablet Commonly known as: NORVASC Take 1 tablet (2.5 mg total) by mouth daily.   metFORMIN 500 MG tablet Commonly known as: GLUCOPHAGE Take 500 mg by mouth daily.   metoprolol succinate 25 MG 24 hr tablet Commonly known as: TOPROL-XL Take 1 tablet (25 mg total)  by mouth daily. Take with or immediately following a meal. What changed:  medication strength See the new instructions.   omeprazole 40 MG capsule Commonly known as: PRILOSEC Take 40 mg by mouth daily.   pravastatin 40 MG tablet Commonly known as: PRAVACHOL Take 40 mg by mouth daily. Every other day   Systane 0.4-0.3 % Soln Generic drug: Polyethyl Glycol-Propyl Glycol Place 1 drop into both eyes every morning.   traMADol 50 MG tablet Commonly known as: ULTRAM Take 50 mg by mouth every 6 (six) hours as needed for moderate pain (pain score 4-6) or severe pain (pain score 7-10).   vitamin B-12 100 MCG tablet Commonly known as: CYANOCOBALAMIN Take 100 mcg by mouth 2 (two) times a week.   Vitamin D-3 125 MCG (5000 UT) Tabs Take 5,000 Units by mouth daily as needed (When she remembers to take it).        Allergies  Allergen Reactions   Wheat     ciliac disease   Penicillins Rash    Waterblisters     Discharge Exam: Vitals:   09/28/23 1915 09/29/23 0453  BP: 130/71 (!) 144/82  Pulse: (!) 56 (!) 56  Resp: 18 18  Temp: 98.7 F (37.1 C) 98.3 F (36.8 C)  SpO2: 100% 98%    Physical Exam Vitals and nursing note reviewed.  Constitutional:      General: She is not in acute distress.    Appearance: She is not toxic-appearing or diaphoretic.     Comments: Appears younger than stated age of 57  HENT:     Head: Normocephalic and atraumatic.     Nose: Nose normal.  Eyes:     General: No scleral icterus. Pulmonary:     Effort: Pulmonary effort is normal.  Abdominal:     General: There is no distension.  Musculoskeletal:     Right lower leg: No edema.     Left lower leg: No edema.  Neurological:     Mental Status: She is alert and oriented to person, place, and time.     The results of significant diagnostics from this hospitalization (including imaging, microbiology, ancillary and laboratory) are listed below for reference.    Microbiology: Recent Results  (from the past 240 hours)  Resp panel by RT-PCR (RSV, Flu A&B, Covid) Anterior Nasal Swab     Status: None   Collection Time: 09/25/23  2:15 PM   Specimen: Anterior Nasal Swab  Result Value Ref Range Status   SARS Coronavirus 2 by RT PCR NEGATIVE NEGATIVE Final   Influenza A by PCR NEGATIVE NEGATIVE Final   Influenza B by PCR NEGATIVE NEGATIVE Final    Comment: (NOTE) The Xpert Xpress SARS-CoV-2/FLU/RSV plus assay  is intended as an aid in the diagnosis of influenza from Nasopharyngeal swab specimens and should not be used as a sole basis for treatment. Nasal washings and aspirates are unacceptable for Xpert Xpress SARS-CoV-2/FLU/RSV testing.  Fact Sheet for Patients: BloggerCourse.com  Fact Sheet for Healthcare Providers: SeriousBroker.it  This test is not yet approved or cleared by the [place] FDA and has been authorized for detection and/or diagnosis of SARS-CoV-2 by FDA under an Emergency Use Authorization (EUA). This EUA will remain in effect (meaning this test can be used) for the duration of the COVID-19 declaration under Section 564(b)(1) of the Act, 21 U.S.C. section 360bbb-3(b)(1), unless the authorization is terminated or revoked.     Resp Syncytial Virus by PCR NEGATIVE NEGATIVE Final    Comment: (NOTE) Fact Sheet for Patients: BloggerCourse.com  Fact Sheet for Healthcare Providers: SeriousBroker.it  This test is not yet approved or cleared by the [place] FDA and has been authorized for detection and/or diagnosis of SARS-CoV-2 by FDA under an Emergency Use Authorization (EUA). This EUA will remain in effect (meaning this test can be used) for the duration of the COVID-19 declaration under Section 564(b)(1) of the Act, 21 U.S.C. section 360bbb-3(b)(1), unless the authorization is terminated or revoked.  Performed at [facility], [address], [place], [place] [address]   MRSA Next Gen by PCR, Nasal     Status: None   Collection Time: 09/25/23  9:44 PM   Specimen: Nasal Mucosa; Nasal Swab  Result Value Ref Range Status   MRSA by PCR Next Gen NOT DETECTED NOT DETECTED Final    Comment: (NOTE) The GeneXpert MRSA Assay (FDA approved for NASAL specimens only), is one component of a comprehensive MRSA colonization surveillance program. It is not intended to diagnose MRSA infection nor to guide or monitor treatment for MRSA infections. Test performance is not FDA approved in patients less than 50 years old. Performed at [facility], [address], [place], [place] [address]      Labs: Basic Metabolic Panel: Recent Labs  Lab 09/26/23 [phone] 09/26/23 1356 09/27/23 0154 09/27/23 0752 09/27/23 1417 09/28/23 0743 09/29/23 0730  NA 120*   < > 124* 126* 126* 128* 130*  K 3.5   < > 3.4* 3.5 3.4* 4.3 4.4  CL 88*   < > 91* 91* 92* 98 98  CO2 21*   < > 21* 23 20* 20* 25  GLUCOSE 126*   < > 121* 147* 169* 127* 127*  BUN 9   < > 15 15 18 17 14   CREATININE 0.55   < > 0.61 0.68 0.73 0.65 0.63  CALCIUM 8.5*   < > 9.0 9.0 9.0 8.7* 8.9  MG 1.9  --   --   --   --   --  2.3  PHOS 2.5  --   --   --   --   --   --    < > = values in this interval not displayed.   Liver Function Tests: Recent Labs  Lab 09/25/23 1421 09/28/23 0743  AST 30 23  ALT 34 36  ALKPHOS 37* 32*  BILITOT 1.1 1.0  PROT 7.5 6.8  ALBUMIN 4.2 3.6   Recent Labs  Lab 09/25/23 1421  LIPASE 39    CBC: Recent Labs  Lab 09/25/23 1421 09/25/23 1821 09/25/23 2004 09/26/23 0243 09/27/23 0154  WBC 8.7  --  9.6 9.9 7.5  HGB 15.5* 15.0 14.3 14.6 14.4  HCT 41.5 44.0 38.9 RESULTS UNAVAILABLE DUE TO INTERFERING SUBSTANCE 39.0  MCV 92.4  --  93.1 RESULTS UNAVAILABLE DUE TO INTERFERING SUBSTANCE 92.0  PLT 378  --  328 317 304   CBG: Recent Labs  Lab 09/25/23 2142 09/26/23 0722  GLUCAP 113* 133*   Thyroid function studies Recent Labs     09/27/23 1417  TSH 0.945   Hyponatremia:(serum Na, serum Osm, urine Osm, urine Na, cortisol, TSH, FT4, Total Chol, Trig, HDL, VLDL, LDL) Recent Labs    09/27/23 1417 09/28/23 0743 09/29/23 0730  NA 126*   < > 130*  TSH 0.945  --   --    < > = values in this interval not displayed.     Urinalysis    Component Value Date/Time   COLORURINE STRAW (A) 09/25/2023 2004   APPEARANCEUR CLEAR 09/25/2023 2004   LABSPEC 1.013 09/25/2023 2004   PHURINE 8.0 09/25/2023 2004   GLUCOSEU NEGATIVE 09/25/2023 2004   HGBUR NEGATIVE 09/25/2023 2004   BILIRUBINUR NEGATIVE 09/25/2023 2004   KETONESUR NEGATIVE 09/25/2023 2004   PROTEINUR NEGATIVE 09/25/2023 2004   UROBILINOGEN 0.2 10/07/2013 1112   NITRITE NEGATIVE 09/25/2023 2004   LEUKOCYTESUR NEGATIVE 09/25/2023 2004   Sepsis Labs Recent Labs  Lab 09/25/23 1421 09/25/23 2004 09/26/23 0243 09/27/23 0154  WBC 8.7 9.6 9.9 7.5    Procedures/Studies: MR HIP LEFT WO CONTRAST Result Date: 09/29/2023 CLINICAL DATA:  Hip pain, chronic, no prior imaging EXAM: MR OF THE LEFT HIP WITHOUT CONTRAST TECHNIQUE: Multiplanar, multisequence MR imaging was performed. No intravenous contrast was administered. COMPARISON:  Pelvic CT 09/25/2023 FINDINGS: Bones: There is no evidence of acute fracture, dislocation or femoral head osteonecrosis. Moderate chronic degenerative changes at the symphysis pubis. The sacroiliac joints appear unremarkable. Lumbar spine findings dictated separately. Articular cartilage and labrum Articular cartilage: Mild degenerative changes at both hips. There is a small subchondral cyst anteriorly in the left acetabulum. No focal femoral head chondral defect or significant subchondral signal abnormality. Labrum: There is no gross labral tear or paralabral abnormality. Joint or bursal effusion Joint effusion: No significant hip joint effusion. Bursae: No focal periarticular fluid collection. Muscles and tendons Muscles and tendons: The  visualized gluteus, hamstring and iliopsoas tendons appear normal. No focal muscular atrophy or edema. The piriformis muscles appear symmetric. Other findings Miscellaneous: Chronic pelvic floor laxity status post hysterectomy. The visualized internal pelvic contents are otherwise unremarkable. IMPRESSION: 1. Mild degenerative changes at both hips. Moderate osteitis pubis. No acute osseous findings. 2. No evidence of labral tear or significant tendinopathy. 3. Chronic pelvic floor laxity status post hysterectomy. Electronically Signed   By: Carey Bullocks M.D.   On: 09/29/2023 09:01   MR LUMBAR SPINE WO CONTRAST Result Date: 09/28/2023 CLINICAL DATA:  Initial evaluation for lumbar radiculopathy, lower back pain. EXAM: MRI LUMBAR SPINE WITHOUT CONTRAST TECHNIQUE: Multiplanar, multisequence MR imaging of the lumbar spine was performed. No intravenous contrast was administered. COMPARISON:  None Available. FINDINGS: Segmentation: Standard. Lowest well-formed disc space labeled the L5-S1 level. Alignment: 2 mm degenerative retrolisthesis of L2 on L3. Underlying mild dextroscoliosis. Alignment otherwise normal with preservation of the normal lumbar lordosis. Vertebrae: Mild chronic compression deformity noted at the superior endplate of T12. Vertebral body height otherwise maintained without acute or recent fracture. Bone marrow signal intensity heterogeneous without worrisome osseous lesion. Degenerative reactive endplate changes noted about the L3-4 through L5-S1 interspaces. No other abnormal marrow edema. Conus medullaris and cauda equina: Conus extends to the L1 level. Conus and cauda  equina appear normal. Paraspinal and other soft tissues: Paraspinous soft tissues within normal limits. T2 hyperintense exophytic right renal cyst partially visualized, benign in appearance, no follow-up imaging recommended. Disc levels: T11-12: Trace bony retropulsion related to the chronic T12 compression fracture. No spinal  stenosis. Foramina remain patent. T12-L1: Unremarkable. L1-2:  Unremarkable. L2-3: Trace retrolisthesis with degenerative intervertebral disc space narrowing. Diffuse disc bulge with disc desiccation. Mild bilateral facet hypertrophy. No significant spinal stenosis. Mild bilateral L2 foraminal narrowing. L3-4: Degenerative intervertebral disc space narrowing with diffuse disc bulge and disc desiccation. Disc bulging slightly asymmetric to the left. Mild bilateral facet hypertrophy. Resultant mild narrowing of the left lateral recess, with mild bilateral L3 foraminal stenosis. L4-5: Degenerative intervertebral disc space narrowing with disc desiccation and diffuse disc bulge. Mild reactive endplate spurring. Moderate bilateral facet hypertrophy. No significant spinal stenosis. Mild bilateral L4 foraminal narrowing. L5-S1: Degenerative intervertebral disc space narrowing with disc desiccation and diffuse disc bulge. Reactive endplate spurring. Mild to moderate right with mild left facet hypertrophy. No spinal stenosis. Mild left with mild to moderate right L5 foraminal narrowing. IMPRESSION: 1. Multilevel lumbar spondylosis with resultant mild bilateral L2 through L4 foraminal stenosis, with mild to moderate right L5 foraminal narrowing. No significant spinal stenosis within the lumbar spine. 2. Mild chronic compression deformity at the superior endplate of T12. Electronically Signed   By: Rise Mu M.D.   On: 09/28/2023 20:10   CT ABDOMEN PELVIS W CONTRAST Result Date: 09/25/2023 CLINICAL DATA:  Abdominal pain.  Headache. EXAM: CT ABDOMEN AND PELVIS WITH CONTRAST TECHNIQUE: Multidetector CT imaging of the abdomen and pelvis was performed using the standard protocol following bolus administration of intravenous contrast. RADIATION DOSE REDUCTION: This exam was performed according to the departmental dose-optimization program which includes automated exposure control, adjustment of the mA and/or kV  according to patient size and/or use of iterative reconstruction technique. CONTRAST:  75mL OMNIPAQUE IOHEXOL 350 MG/ML SOLN COMPARISON:  Abdominal MRI 02/04/2019, CT chest 03/24/2019 FINDINGS: Lower chest: Nodular density in the RIGHT middle lobe on the first slice (image 1/5) corresponds to a benign vascular structure on comparison CT. Mild basilar atelectasis. Hepatobiliary: Round lesion the posterior RIGHT hepatic lobe measuring 11 mm corresponds to a benign cyst on comparison MRI. Delayed phase imaging demonstrates low-attenuation liver suggesting hepatic steatosis. Findings are also confirmed on comparison MRI. Gallbladder is normal. Common bile duct normal. Pancreas: Pancreas is normal. No ductal dilatation. No pancreatic inflammation. Spleen: Normal spleen Adrenals/urinary tract: Adrenal glands and kidneys are normal. The ureters and bladder normal. Stomach/Bowel: Stomach, small bowel, appendix, and cecum are normal. The colon and rectosigmoid colon are normal. Vascular/Lymphatic: Abdominal aorta is normal caliber. No periportal or retroperitoneal adenopathy. No pelvic adenopathy. Reproductive: Post hysterectomy. Nodular soft tissue thickening in the pelvic floor anterior to the base the bladder appears benign. Potential postsurgical ( sagittal image 114/series 7). Other: No free fluid. Musculoskeletal: No aggressive osseous lesion. IMPRESSION: 1. No acute findings in the abdomen pelvis. 2. Hepatic steatosis. 3. Normal gallbladder and biliary tree. 4. Normal appendix. 5. Post hysterectomy. Electronically Signed   By: Genevive Bi M.D.   On: 09/25/2023 18:21   CT Head Wo Contrast Result Date: 09/25/2023 CLINICAL DATA:  Headache EXAM: CT HEAD WITHOUT CONTRAST TECHNIQUE: Contiguous axial images were obtained from the base of the skull through the vertex without intravenous contrast. RADIATION DOSE REDUCTION: This exam was performed according to the departmental dose-optimization program which includes  automated exposure control, adjustment of the mA and/or kV according  to patient size and/or use of iterative reconstruction technique. COMPARISON:  None Available. FINDINGS: Brain: No evidence of acute infarction, hemorrhage, hydrocephalus, extra-axial collection or mass lesion/mass effect. Vascular: No hyperdense vessel or unexpected calcification. Skull: Normal. Negative for fracture or focal lesion. Sinuses/Orbits: No acute finding. Other: None. IMPRESSION: No acute intracranial abnormality. Electronically Signed   By: Darliss Cheney M.D.   On: 09/25/2023 17:59    Time coordinating discharge: 50 mins  SIGNED:  [name], DO [facility] 09/29/23, 1:49 PM

## 2023-09-29 NOTE — Assessment & Plan Note (Addendum)
 09-29-2023 pt wants outpatient referral to [facility] to discuss lumbar radiculopathy. Outpatient referral made. Pt to continue 1000 mg tylenol QID.   MRI lumbar spine performed yesterday. Minimal degenerative changes to hips bilaterally. I think her left hip pain/knee pain  is referred pain from her lumbar spine.  MRI lumbar spine shows "Multilevel lumbar spondylosis with resultant mild bilateral L2 through L4 foraminal stenosis, with mild to moderate right L5 foraminal narrowing. No significant spinal stenosis within the lumbar spine. 2. Mild chronic compression deformity at the superior endplate of T12."  I think she would benefit from [facility].

## 2023-09-29 NOTE — Progress Notes (Signed)
 PROGRESS NOTE    Jessica Conley  OZH:[id number] DOB: Jun 16, 1946 DOA: 09/25/2023 PCP: Irena Reichmann, DO  Subjective: Pt seen and examined. Pt received po ultram this AM around 6 AM. RN reports pt very sleeping and drowsy. Met with pt and husband at bedside. Pt having left buttock pain with radiation down her left buttock and into her left hamstring.  Has outpatient MRI left hip ordered.  Na of 128 this AM.   Hospital Course: HPI: 78 year old female patient with history of hypertension, type 2 diabetes mellitus, hyperlipidemia, recent negative coronary angiogram presents now on 09/25/2023 to [facility] emergency room chief complaint of 7 out of 10 abdominal pain, associated with a headache and fatigue, increasing, generalized sensation, some burning, progresses well that she sought medical attention. She denies nausea and vomiting. She has some generalized pain and arthritic symptoms taking prednisone.,  Also admits to headache as above. In the emergency room, patient was found to have sodium of 116, bicarb of 16, anion gap slightly elevated. CT abdomen pelvis negative for acute pathology, CT head also negative. She has noted to take diuretics at home.  Initially patient was planned to be admitted to the hospitalist service; however, given low sodium and concern for acute decline she will be admitted to the ICU.  Significant Events: Admitted 09/25/2023 to PCCM service for acute hyponatremia 09-26-2023 given IVF with NS. Pt did not receive 3% saline.  Na improved to 121 09-27-2023 pt's care transferred to [facility]  Significant Labs: WBC 8.7, HgB 15.5, Plt 378 Na 116, K 3.0, Cl 82, CO2 of 16, BUN 12, Scr 0.61 Urine Osm 442 Urine Na 75 Serum Osm 244  Significant Imaging Studies: CT abd/pelvis No acute findings in the abdomen pelvis. 2. Hepatic steatosis. 3. Normal gallbladder and biliary tree. 4. Normal appendix. 5. Post hysterectomy. CT head No acute intracranial abnormality.    Antibiotic Therapy: Anti-infectives (From admission, onward)    None       Procedures:   Consultants: PCCM    Assessment and Plan: * Hyponatremia 09-27-2023 TRH assumed care today. 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  Na up to 126. Pt able to ambulate well over 150 feet without any assistance. Would like her serum Na to be >=130 prior to discharge. Will hold hydrochlorothiazide at discharge. Pt need to f/u with PCP next week for repeat BMP. Pt will need alternative HTN med besides hydrochlorothiazide when she sees her PCP.  Pt states she normally walks about 3 miles per day. She appears much younger than stated age of 64. Check TSH.  09-27-2022 TSH normal. Na slowly improving. Na still 128. Will start salt tabs 1 g bid. Repeat BMP in AM. Need her Na to be at least 130 prior to discharge.  09-29-2023 Na up to 130. Pt to eat a liberal salt diet this weekend. Stop ARB/hydrochlorothiazide at discharge. Pt to have repeat Na level drawn at PCP office next week. Pt able to ambulate hallways without difficulty per husband.  Left lumbar radiculopathy 09-29-2023 pt wants outpatient referral to [facility] to discuss lumbar radiculopathy. Outpatient referral made. Pt to continue 1000 mg tylenol QID.   Pain of left hip joint 09-28-2023 continues to have radicular type pain down her left buttock and radiating into her left hamstring/left knee. Outpatient MRI left hip ordered by ortho. Will get MRI Lumbar spine and left hip. Stop ultram. Start scheduled tylenol.  09-29-2023 MRI left hip and MRI lumbar spine performed yesterday. Minimal degenerative changes to  hips bilaterally. I think her left hip pain/knee pain  is referred pain from her lumbar spine.  MRI lumbar spine shows "Multilevel lumbar spondylosis with resultant mild bilateral L2 through L4 foraminal stenosis, with mild to moderate right L5 foraminal narrowing. No significant spinal stenosis within the lumbar spine. 2. Mild  chronic compression deformity at the superior endplate of T12."  I think she would benefit from [facility]. Pt wants referral to [facility] at discharge. Continue with tylenol 1000 mg qid.  Mixed hyperlipidemia 09-27-2023 continue with pravastatin.  09-28-2023 stable.  09-29-2023 continue pravastatin at discharge.  Essential hypertension 09-27-2023 on Toprol-XL 25 mg every day and norvasc 2.5 mg qday . Do not restart hydrochlorothiazide.  09-28-2023 BP controlled on Toprol-XL and norvasc.  09-29-2023 stop ARB/hydrochlorothiazide at discharge. Continue with Toprol-XL and norvasc at discharge. Pt to f/u with PCP regarding HTN management.  Type 2 diabetes mellitus without complication, without long-term current use of insulin (HCC) 09-27-2023 stable. Pt can restart metformin at discharge.  09-28-2023 stable.  09-29-2023 stable. Restart metformin.  DVT prophylaxis: SCDs Start: 09/25/23 2002    Code Status: Full Code Family Communication: discussed with pt, husband glen and dtr at bedside Disposition Plan: return home Reason for continuing need for hospitalization: stable for DC today.  Objective: Vitals:   09/28/23 1059 09/28/23 1231 09/28/23 1915 09/29/23 0453  BP:  137/80 130/71 (!) 144/82  Pulse: 62 (!) 56 (!) 56 (!) 56  Resp:  19 18 18   Temp:  97.9 F (36.6 C) 98.7 F (37.1 C) 98.3 F (36.8 C)  TempSrc:  Oral Oral Oral  SpO2:  97% 100% 98%  Weight:      Height:       No intake or output data in the 24 hours ending 09/29/23 1345 Filed Weights   09/26/23 0400 09/27/23 0501 09/28/23 0636  Weight: 67.4 kg 67.2 kg 64.7 kg   Examination:  Physical Exam Vitals and nursing note reviewed.  Constitutional:      General: She is not in acute distress.    Appearance: She is not toxic-appearing or diaphoretic.     Comments: Appears younger than stated age of 39  HENT:     Head: Normocephalic and atraumatic.     Nose: Nose normal.  Eyes:     General: No scleral  icterus. Pulmonary:     Effort: Pulmonary effort is normal.  Abdominal:     General: There is no distension.  Musculoskeletal:     Right lower leg: No edema.     Left lower leg: No edema.  Neurological:     Mental Status: She is alert and oriented to person, place, and time.   Data Reviewed: I have personally reviewed following labs and imaging studies  CBC: Recent Labs  Lab 09/25/23 1421 09/25/23 1821 09/25/23 2004 09/26/23 0243 09/27/23 0154  WBC 8.7  --  9.6 9.9 7.5  HGB 15.5* 15.0 14.3 14.6 14.4  HCT 41.5 44.0 38.9 RESULTS UNAVAILABLE DUE TO INTERFERING SUBSTANCE 39.0  MCV 92.4  --  93.1 RESULTS UNAVAILABLE DUE TO INTERFERING SUBSTANCE 92.0  PLT 378  --  328 317 304   Basic Metabolic Panel: Recent Labs  Lab 09/26/23 0243 09/26/23 1356 09/27/23 0154 09/27/23 0752 09/27/23 1417 09/28/23 0743 09/29/23 0730  NA 120*   < > 124* 126* 126* 128* 130*  K 3.5   < > 3.4* 3.5 3.4* 4.3 4.4  CL 88*   < > 91* 91* 92* 98 98  CO2 21*   < >  21* 23 20* 20* 25  GLUCOSE 126*   < > 121* 147* 169* 127* 127*  BUN 9   < > 15 15 18 17 14   CREATININE 0.55   < > 0.61 0.68 0.73 0.65 0.63  CALCIUM 8.5*   < > 9.0 9.0 9.0 8.7* 8.9  MG 1.9  --   --   --   --   --  2.3  PHOS 2.5  --   --   --   --   --   --    < > = values in this interval not displayed.   GFR: Estimated Creatinine Clearance: 57.5 mL/min (by C-G formula based on SCr of 0.63 mg/dL). Liver Function Tests: Recent Labs  Lab 09/25/23 1421 09/28/23 0743  AST 30 23  ALT 34 36  ALKPHOS 37* 32*  BILITOT 1.1 1.0  PROT 7.5 6.8  ALBUMIN 4.2 3.6   Recent Labs  Lab 09/25/23 1421  LIPASE 39   CBG: Recent Labs  Lab 09/25/23 2142 09/26/23 0722  GLUCAP 113* 133*   Thyroid Function Tests: Recent Labs    09/27/23 1417  TSH 0.945    Recent Results (from the past 240 hours)  Resp panel by RT-PCR (RSV, Flu A&B, Covid) Anterior Nasal Swab     Status: None   Collection Time: 09/25/23  2:15 PM   Specimen: Anterior Nasal  Swab  Result Value Ref Range Status   SARS Coronavirus 2 by RT PCR NEGATIVE NEGATIVE Final   Influenza A by PCR NEGATIVE NEGATIVE Final   Influenza B by PCR NEGATIVE NEGATIVE Final    Comment: (NOTE) The Xpert Xpress SARS-CoV-2/FLU/RSV plus assay is intended as an aid in the diagnosis of influenza from Nasopharyngeal swab specimens and should not be used as a sole basis for treatment. Nasal washings and aspirates are unacceptable for Xpert Xpress SARS-CoV-2/FLU/RSV testing.  Fact Sheet for Patients: BloggerCourse.com  Fact Sheet for Healthcare Providers: SeriousBroker.it  This test is not yet approved or cleared by the [place] FDA and has been authorized for detection and/or diagnosis of SARS-CoV-2 by FDA under an Emergency Use Authorization (EUA). This EUA will remain in effect (meaning this test can be used) for the duration of the COVID-19 declaration under Section 564(b)(1) of the Act, 21 U.S.C. section 360bbb-3(b)(1), unless the authorization is terminated or revoked.     Resp Syncytial Virus by PCR NEGATIVE NEGATIVE Final    Comment: (NOTE) Fact Sheet for Patients: BloggerCourse.com  Fact Sheet for Healthcare Providers: SeriousBroker.it  This test is not yet approved or cleared by the [place] FDA and has been authorized for detection and/or diagnosis of SARS-CoV-2 by FDA under an Emergency Use Authorization (EUA). This EUA will remain in effect (meaning this test can be used) for the duration of the COVID-19 declaration under Section 564(b)(1) of the Act, 21 U.S.C. section 360bbb-3(b)(1), unless the authorization is terminated or revoked.  Performed at [facility], [address] [place], [place] [address]   MRSA Next Gen by PCR, Nasal     Status: None   Collection Time: 09/25/23  9:44 PM   Specimen: Nasal Mucosa; Nasal Swab  Result Value Ref  Range Status   MRSA by PCR Next Gen NOT DETECTED NOT DETECTED Final    Comment: (NOTE) The GeneXpert MRSA Assay (FDA approved for NASAL specimens only), is one component of a comprehensive MRSA colonization surveillance program. It is not intended to diagnose MRSA infection nor to guide or monitor  treatment for MRSA infections. Test performance is not FDA approved in patients less than 73 years old. Performed at [facility], [address], [place], [place] [address]      Radiology Studies: MR HIP LEFT WO CONTRAST Result Date: 09/29/2023 CLINICAL DATA:  Hip pain, chronic, no prior imaging EXAM: MR OF THE LEFT HIP WITHOUT CONTRAST TECHNIQUE: Multiplanar, multisequence MR imaging was performed. No intravenous contrast was administered. COMPARISON:  Pelvic CT 09/25/2023 FINDINGS: Bones: There is no evidence of acute fracture, dislocation or femoral head osteonecrosis. Moderate chronic degenerative changes at the symphysis pubis. The sacroiliac joints appear unremarkable. Lumbar spine findings dictated separately. Articular cartilage and labrum Articular cartilage: Mild degenerative changes at both hips. There is a small subchondral cyst anteriorly in the left acetabulum. No focal femoral head chondral defect or significant subchondral signal abnormality. Labrum: There is no gross labral tear or paralabral abnormality. Joint or bursal effusion Joint effusion: No significant hip joint effusion. Bursae: No focal periarticular fluid collection. Muscles and tendons Muscles and tendons: The visualized gluteus, hamstring and iliopsoas tendons appear normal. No focal muscular atrophy or edema. The piriformis muscles appear symmetric. Other findings Miscellaneous: Chronic pelvic floor laxity status post hysterectomy. The visualized internal pelvic contents are otherwise unremarkable. IMPRESSION: 1. Mild degenerative changes at both hips. Moderate osteitis pubis. No acute osseous findings. 2. No evidence of  labral tear or significant tendinopathy. 3. Chronic pelvic floor laxity status post hysterectomy. Electronically Signed   By: Carey Bullocks M.D.   On: 09/29/2023 09:01   MR LUMBAR SPINE WO CONTRAST Result Date: 09/28/2023 CLINICAL DATA:  Initial evaluation for lumbar radiculopathy, lower back pain. EXAM: MRI LUMBAR SPINE WITHOUT CONTRAST TECHNIQUE: Multiplanar, multisequence MR imaging of the lumbar spine was performed. No intravenous contrast was administered. COMPARISON:  None Available. FINDINGS: Segmentation: Standard. Lowest well-formed disc space labeled the L5-S1 level. Alignment: 2 mm degenerative retrolisthesis of L2 on L3. Underlying mild dextroscoliosis. Alignment otherwise normal with preservation of the normal lumbar lordosis. Vertebrae: Mild chronic compression deformity noted at the superior endplate of T12. Vertebral body height otherwise maintained without acute or recent fracture. Bone marrow signal intensity heterogeneous without worrisome osseous lesion. Degenerative reactive endplate changes noted about the L3-4 through L5-S1 interspaces. No other abnormal marrow edema. Conus medullaris and cauda equina: Conus extends to the L1 level. Conus and cauda equina appear normal. Paraspinal and other soft tissues: Paraspinous soft tissues within normal limits. T2 hyperintense exophytic right renal cyst partially visualized, benign in appearance, no follow-up imaging recommended. Disc levels: T11-12: Trace bony retropulsion related to the chronic T12 compression fracture. No spinal stenosis. Foramina remain patent. T12-L1: Unremarkable. L1-2:  Unremarkable. L2-3: Trace retrolisthesis with degenerative intervertebral disc space narrowing. Diffuse disc bulge with disc desiccation. Mild bilateral facet hypertrophy. No significant spinal stenosis. Mild bilateral L2 foraminal narrowing. L3-4: Degenerative intervertebral disc space narrowing with diffuse disc bulge and disc desiccation. Disc bulging  slightly asymmetric to the left. Mild bilateral facet hypertrophy. Resultant mild narrowing of the left lateral recess, with mild bilateral L3 foraminal stenosis. L4-5: Degenerative intervertebral disc space narrowing with disc desiccation and diffuse disc bulge. Mild reactive endplate spurring. Moderate bilateral facet hypertrophy. No significant spinal stenosis. Mild bilateral L4 foraminal narrowing. L5-S1: Degenerative intervertebral disc space narrowing with disc desiccation and diffuse disc bulge. Reactive endplate spurring. Mild to moderate right with mild left facet hypertrophy. No spinal stenosis. Mild left with mild to moderate right L5 foraminal narrowing. IMPRESSION: 1. Multilevel lumbar spondylosis with resultant mild  bilateral L2 through L4 foraminal stenosis, with mild to moderate right L5 foraminal narrowing. No significant spinal stenosis within the lumbar spine. 2. Mild chronic compression deformity at the superior endplate of T12. Electronically Signed   By: Rise Mu M.D.   On: 09/28/2023 20:10    Scheduled Meds:  acetaminophen  1,000 mg Oral QID   amLODipine  2.5 mg Oral Daily   Chlorhexidine Gluconate Cloth  6 each Topical Daily   diclofenac Sodium  4 g Topical QID   famotidine  20 mg Oral BID   metoprolol succinate  25 mg Oral Daily   pravastatin  40 mg Oral Daily   sodium chloride  1 g Oral BID WC   Continuous Infusions:   LOS: 4 days   Time spent: 40 minutes  [name], DO  [facility]  09/29/2023, 1:45 PM

## 2023-09-29 NOTE — Plan of Care (Signed)

## 2023-10-01 ENCOUNTER — Telehealth: Payer: Self-pay | Admitting: *Deleted

## 2023-10-01 ENCOUNTER — Ambulatory Visit (HOSPITAL_COMMUNITY): Admission: RE | Admit: 2023-10-01 | Payer: Medicare Other | Source: Ambulatory Visit

## 2023-10-01 ENCOUNTER — Ambulatory Visit: Admitting: Physical Medicine and Rehabilitation

## 2023-10-01 ENCOUNTER — Encounter: Payer: Self-pay | Admitting: Physical Medicine and Rehabilitation

## 2023-10-01 ENCOUNTER — Encounter: Payer: Self-pay | Admitting: Hematology

## 2023-10-01 VITALS — BP 145/93 | HR 67

## 2023-10-01 DIAGNOSIS — M5442 Lumbago with sciatica, left side: Secondary | ICD-10-CM | POA: Diagnosis not present

## 2023-10-01 DIAGNOSIS — G8929 Other chronic pain: Secondary | ICD-10-CM | POA: Diagnosis not present

## 2023-10-01 DIAGNOSIS — M48061 Spinal stenosis, lumbar region without neurogenic claudication: Secondary | ICD-10-CM

## 2023-10-01 DIAGNOSIS — M5416 Radiculopathy, lumbar region: Secondary | ICD-10-CM

## 2023-10-01 NOTE — Progress Notes (Unsigned)
 Pain Score 3

## 2023-10-01 NOTE — Transitions of Care (Post Inpatient/ED Visit) (Signed)
   10/01/2023  Name: Jessica Conley MRN: [id number] DOB: 1945-11-12  Today's TOC FU Call Status: Today's TOC FU Call Status:: Unsuccessful Call (1st Attempt) Unsuccessful Call (1st Attempt) Date: 10/01/23  Attempted to reach the patient regarding the most recent Inpatient/ED visit.  Follow Up Plan: Additional outreach attempts will be made to reach the patient to complete the Transitions of Care (Post Inpatient/ED visit) call.   Irving Shows [facility], BSN RN Care Manager/ Transition of Care [place] [phone]

## 2023-10-01 NOTE — Patient Instructions (Signed)

## 2023-10-01 NOTE — Progress Notes (Unsigned)
 Jessica Conley - 78 y.o. female MRN [id number]  Date of birth: 10-28-1945  Office Visit Note: Visit Date: 10/01/2023 PCP: Irena Reichmann, DO Referred by: Irena Reichmann, DO  Subjective: Chief Complaint  Patient presents with   Left Leg - Pain, Numbness   HPI: Jessica Conley is a 78 y.o. female who comes in today per the request of Dr. [name] for evaluation of chronic, worsening and severe left sided lower back pain radiating to buttock, groin and down anterior thigh. She does reports intermittent paresthesias to left lower leg. Pain ongoing for several weeks. Her pain worsens with sitting and laying flat. Walking seems to alleviate her pain. She describes her pain as aching sensation, currently rates as 3 out of 10. Some relief of pain with home exercise regimen, rest and use of medications. Some relief of pain with Tylenol. No history of lumbar surgery/injections. Recent lumbar MRI imaging shows mild chronic compression deformity of superior endplate of T12, mild left lateral recess narrowing on the left at L3-L4. No high grade spinal canal stenosis. Patient was recently hospitalized for hyponatremia. These notes can be reviewed further in EPIC. She reports her pain is slowly starting to improve, however she does feel week from recent hospitalization. Patient denies focal weakness. No recent trauma or falls.      Review of Systems  Constitutional:  Positive for malaise/fatigue.  Musculoskeletal:  Positive for back pain.  Neurological:  Positive for tingling and weakness.  All other systems reviewed and are negative.  Otherwise per HPI.  Assessment & Plan: Visit Diagnoses:    ICD-10-CM   1. Chronic left-sided low back pain with left-sided sciatica  M54.42    G89.29     2. Lumbar radiculopathy  M54.16     3. Stenosis of lateral recess of lumbar spine  M48.061        Plan: Findings:  Chronic, worsening and severe left sided lower back pain radiating to buttock,  groin and down anterior thigh. Intermittent paresthesias to left lower leg. Patient continues to have severe pain despite good conservative therapies such as home exercise regimen, rest and use of medications. Patients clinical presentation and exam are consistent with L4 nerve pattern. There is mild left lateral recess narrowing on the left at L3-L4. Structurally, her spine looks fairly unremarkable for her age. We discussed treatment plan in detail today, including possibility of performing lumbar epidural steroid injection. Her pain is mild at this time and is manageable with conservative therapies. She would like to hold on injection at this time. I provided her with Dr. Kellie Simmering "Big Three" exercises to perform at home. I do think she would benefit from short course of formal physical therapy, she will hold on this for now. I instructed her to contact us should her pain increase or change in nature. She has no questions at this time. I encouraged her to remain active as tolerated. No red flag symptoms noted upon exam today.     Meds & Orders: No orders of the defined types were placed in this encounter.  No orders of the defined types were placed in this encounter.   Follow-up: Return if symptoms worsen or fail to improve.   Procedures: No procedures performed      Clinical History: MRI LUMBAR SPINE WITHOUT CONTRAST   TECHNIQUE: Multiplanar, multisequence MR imaging of the lumbar spine was performed. No intravenous contrast was administered.   COMPARISON:  None Available.   FINDINGS: Segmentation: Standard. Lowest well-formed  disc space labeled the L5-S1 level.   Alignment: 2 mm degenerative retrolisthesis of L2 on L3. Underlying mild dextroscoliosis. Alignment otherwise normal with preservation of the normal lumbar lordosis.   Vertebrae: Mild chronic compression deformity noted at the superior endplate of T12. Vertebral body height otherwise maintained without acute or  recent fracture. Bone marrow signal intensity heterogeneous without worrisome osseous lesion. Degenerative reactive endplate changes noted about the L3-4 through L5-S1 interspaces. No other abnormal marrow edema.   Conus medullaris and cauda equina: Conus extends to the L1 level. Conus and cauda equina appear normal.   Paraspinal and other soft tissues: Paraspinous soft tissues within normal limits. T2 hyperintense exophytic right renal cyst partially visualized, benign in appearance, no follow-up imaging recommended.   Disc levels:   T11-12: Trace bony retropulsion related to the chronic T12 compression fracture. No spinal stenosis. Foramina remain patent.   T12-L1: Unremarkable.   L1-2:  Unremarkable.   L2-3: Trace retrolisthesis with degenerative intervertebral disc space narrowing. Diffuse disc bulge with disc desiccation. Mild bilateral facet hypertrophy. No significant spinal stenosis. Mild bilateral L2 foraminal narrowing.   L3-4: Degenerative intervertebral disc space narrowing with diffuse disc bulge and disc desiccation. Disc bulging slightly asymmetric to the left. Mild bilateral facet hypertrophy. Resultant mild narrowing of the left lateral recess, with mild bilateral L3 foraminal stenosis.   L4-5: Degenerative intervertebral disc space narrowing with disc desiccation and diffuse disc bulge. Mild reactive endplate spurring. Moderate bilateral facet hypertrophy. No significant spinal stenosis. Mild bilateral L4 foraminal narrowing.   L5-S1: Degenerative intervertebral disc space narrowing with disc desiccation and diffuse disc bulge. Reactive endplate spurring. Mild to moderate right with mild left facet hypertrophy. No spinal stenosis. Mild left with mild to moderate right L5 foraminal narrowing.   IMPRESSION: 1. Multilevel lumbar spondylosis with resultant mild bilateral L2 through L4 foraminal stenosis, with mild to moderate right L5 foraminal narrowing.  No significant spinal stenosis within the lumbar spine. 2. Mild chronic compression deformity at the superior endplate of T12.     Electronically Signed   By: Rise Mu M.D.   On: 09/28/2023 20:10   She reports that she has never smoked. She has never used smokeless tobacco. No results for input(s): "HGBA1C", "LABURIC" in the last 8760 hours.  Objective:  VS:  HT:    WT:   BMI:     BP:(!) 145/93  HR:67bpm  TEMP: ( )  RESP:  Physical Exam  Ortho Exam  Imaging: No results found.  Past Medical/Family/Surgical/Social History: Medications & Allergies reviewed per EMR, new medications updated. Patient Active Problem List   Diagnosis Date Noted   Left lumbar radiculopathy 09/29/2023   Hyponatremia 09/25/2023   Pain of left hip joint 09/14/2023   Abnormal stress test 08/01/2022   Type 2 diabetes mellitus without complication, without long-term current use of insulin (HCC) 06/12/2022   Essential hypertension 06/12/2022   Mixed hyperlipidemia 06/12/2022   Hearing loss 03/26/2020   Pain in finger of left hand 01/15/2018   Hemochromatosis, hereditary (HCC) 05/18/2010   Past Medical History:  Diagnosis Date   Celiac sprue    Hemochromatosis, hereditary (HCC) 05/18/2010   Hypertension    Family History  Problem Relation Age of Onset   Heart attack Mother    Melanoma Father    Breast cancer Daughter 60   Past Surgical History:  Procedure Laterality Date   EYE SURGERY     LEFT HEART CATH AND CORONARY ANGIOGRAPHY N/A 08/01/2022   Procedure: LEFT HEART CATH  AND CORONARY ANGIOGRAPHY;  Surgeon: Elder Negus, MD;  Location: [facility] INVASIVE CV LAB;  Service: Cardiovascular;  Laterality: N/A;   PHLEBOTOMY THERAPEUTIC  08/21/2011       VAGINAL HYSTERECTOMY  early age 89s   prolapse   Social History   Occupational History   Not on file  Tobacco Use   Smoking status: Never   Smokeless tobacco: Never  Vaping Use   Vaping status: Never Used  Substance and Sexual  Activity   Alcohol use: Yes    Alcohol/week: 4.0 standard drinks of alcohol    Types: 4 Glasses of wine per week    Comment: Wine   Drug use: No   Sexual activity: Not on file

## 2023-10-02 ENCOUNTER — Telehealth: Payer: Self-pay | Admitting: *Deleted

## 2023-10-02 DIAGNOSIS — I10 Essential (primary) hypertension: Secondary | ICD-10-CM | POA: Diagnosis not present

## 2023-10-02 DIAGNOSIS — Z09 Encounter for follow-up examination after completed treatment for conditions other than malignant neoplasm: Secondary | ICD-10-CM | POA: Diagnosis not present

## 2023-10-02 DIAGNOSIS — E871 Hypo-osmolality and hyponatremia: Secondary | ICD-10-CM | POA: Diagnosis not present

## 2023-10-02 NOTE — Transitions of Care (Post Inpatient/ED Visit) (Signed)
   10/02/2023  Name: Jessica Conley MRN: [id number] DOB: 04-22-1946  Today's TOC FU Call Status: Today's TOC FU Call Status:: Unsuccessful Call (2nd Attempt) Unsuccessful Call (2nd Attempt) Date: 10/02/23  Attempted to reach the patient regarding the most recent Inpatient/ED visit.  Follow Up Plan: Additional outreach attempts will be made to reach the patient to complete the Transitions of Care (Post Inpatient/ED visit) call.   Irving Shows [facility], BSN RN Care Manager/ Transition of Care [place]/ [facility] [phone]

## 2023-10-03 ENCOUNTER — Other Ambulatory Visit: Payer: Self-pay | Admitting: Physical Medicine and Rehabilitation

## 2023-10-03 ENCOUNTER — Telehealth: Payer: Self-pay | Admitting: *Deleted

## 2023-10-03 DIAGNOSIS — G8929 Other chronic pain: Secondary | ICD-10-CM

## 2023-10-03 DIAGNOSIS — M5416 Radiculopathy, lumbar region: Secondary | ICD-10-CM

## 2023-10-03 DIAGNOSIS — M48061 Spinal stenosis, lumbar region without neurogenic claudication: Secondary | ICD-10-CM

## 2023-10-03 NOTE — Transitions of Care (Post Inpatient/ED Visit) (Signed)
 10/03/2023  Name: Jessica Conley MRN: [id number] DOB: May 10, 1946  Today's TOC FU Call Status: Today's TOC FU Call Status:: Successful TOC FU Call Completed TOC FU Call Complete Date: 10/03/23 Patient's Name and Date of Birth confirmed.  Transition Care Management Follow-up Telephone Call Date of Discharge: 09/29/23 Discharge Facility: [facility]) Type of Discharge: Inpatient Admission Primary Inpatient Discharge Diagnosis:: hyponatremia How have you been since you were released from the hospital?: Better (Doing prettry good. Still having back pain)  Items Reviewed: Did you receive and understand the discharge instructions provided?: Yes Medications obtained,verified, and reconciled?: Yes (Medications Reviewed) Any new allergies since your discharge?: No Dietary orders reviewed?: Yes Type of Diet Ordered:: Heart healthy Do you have support at home?: Yes People in Home: spouse Name of Support/Comfort Primary Source: Jessica Conley  Medications Reviewed Today: Medications Reviewed Today     Reviewed by [name], RN (Case Manager) on 10/03/23 at 1159  Med List Status: <None>   Medication Order Taking? Sig Documenting Provider Last Dose Status Informant  acetaminophen (TYLENOL) 500 MG tablet [id number] Yes Take 2 tablets (1,000 mg total) by mouth 4 (four) times daily. [name], DO Taking Active   amLODipine (NORVASC) 2.5 MG tablet [id number] Yes Take 1 tablet (2.5 mg total) by mouth daily. [name], DO Taking Active   Cholecalciferol (VITAMIN D-3) 125 MCG (5000 UT) TABS [id number] Yes Take 5,000 Units by mouth daily as needed (When she remembers to take it). [provider] Taking Active Self, Pharmacy Records  losartan (COZAAR) 100 MG tablet [id number] Yes Take 100 mg by mouth daily. [provider]  Active            Med Note [name], [name]   Wed Oct 03, 2023 11:59 AM) Awaiting approval from insurance per pt  metFORMIN (GLUCOPHAGE) 500 MG tablet  [id number] Yes Take 500 mg by mouth daily. [provider] Taking Active Self, Pharmacy Records  metoprolol succinate (TOPROL-XL) 25 MG 24 hr tablet [id number] Yes Take 1 tablet (25 mg total) by mouth daily. Take with or immediately following a meal. [name], DO Taking Active   omeprazole (PRILOSEC) 40 MG capsule [id number] Yes Take 40 mg by mouth daily. [provider] Taking Active Self, Pharmacy Records  Polyethyl Glycol-Propyl Glycol (SYSTANE) 0.4-0.3 % SOLN [id number] Yes Place 1 drop into both eyes every morning. [provider] Taking Active Self, Pharmacy Records  pravastatin (PRAVACHOL) 40 MG tablet [id number] Yes Take 40 mg by mouth daily. Every other day [provider] Taking Active Self, Pharmacy Records  traMADol (ULTRAM) 50 MG tablet [id number] Yes Take 50 mg by mouth every 6 (six) hours as needed for moderate pain (pain score 4-6) or severe pain (pain score 7-10). [provider] Taking Active Self, Pharmacy Records  vitamin B-12 (CYANOCOBALAMIN) 100 MCG tablet [id number] Yes Take 100 mcg by mouth 2 (two) times a week. [provider] Taking Active Self, Pharmacy Records            Home Care and Equipment/Supplies: Were Home Health Services Ordered?: NA Any new equipment or medical supplies ordered?: NA  Functional Questionnaire: Do you need assistance with bathing/showering or dressing?: No Do you need assistance with meal preparation?: No Do you need assistance with eating?: No Do you have difficulty maintaining continence: No Do you need assistance with getting out of bed/getting out of a chair/moving?: No Do you have difficulty managing or taking your medications?: No  Follow up appointments reviewed: PCP Follow-up appointment  confirmed?: Yes Date of PCP follow-up appointment?: 10/02/23 Follow-up Provider: [name] Follow-up appointment confirmed?: Yes Date of Specialist follow-up appointment?:  10/01/23 Follow-Up Specialty Provider:: [name] Do you need transportation to your follow-up appointment?: No Do you understand care options if your condition(s) worsen?: Yes-patient verbalized understanding  SDOH Interventions Today    Flowsheet Row Most Recent Value  SDOH Interventions   Food Insecurity Interventions Intervention Not Indicated  Housing Interventions Intervention Not Indicated  Transportation Interventions Intervention Not Indicated, Patient Resources (Friends/Family)  Utilities Interventions Intervention Not Indicated      Interventions Today    Flowsheet Row Most Recent Value  Chronic Disease   Chronic disease during today's visit Other  [hyponatremia]  General Interventions   General Interventions Discussed/Reviewed General Interventions Discussed, General Interventions Reviewed, Doctor Visits, Labs  Labs --  [na last draw 133]  Doctor Visits Discussed/Reviewed Doctor Visits Discussed, Doctor Visits Reviewed, PCP, Specialist  PCP/Specialist Visits Compliance with follow-up visit  Education Interventions   Education Provided Provided Education  Provided Verbal Education On Labs  Labs Reviewed --  [facility] made patient aware of the normal sodium 135-145]  Nutrition Interventions   Nutrition Discussed/Reviewed Nutrition Discussed, Nutrition Reviewed  Pharmacy Interventions   Pharmacy Dicussed/Reviewed Pharmacy Topics Discussed, Pharmacy Topics Reviewed      Patient declined further follow up outreach calls  [name] BSN RN [facility] Health Care Management Coordinator [name] Direct Dial: [phone]  Fax: [phone] Website: [email or web address]

## 2023-10-17 DIAGNOSIS — M1712 Unilateral primary osteoarthritis, left knee: Secondary | ICD-10-CM | POA: Diagnosis not present

## 2023-11-07 DIAGNOSIS — I10 Essential (primary) hypertension: Secondary | ICD-10-CM | POA: Diagnosis not present

## 2023-11-07 DIAGNOSIS — E7800 Pure hypercholesterolemia, unspecified: Secondary | ICD-10-CM | POA: Diagnosis not present

## 2023-11-07 DIAGNOSIS — K219 Gastro-esophageal reflux disease without esophagitis: Secondary | ICD-10-CM | POA: Diagnosis not present

## 2023-11-07 DIAGNOSIS — K760 Fatty (change of) liver, not elsewhere classified: Secondary | ICD-10-CM | POA: Diagnosis not present

## 2023-11-07 DIAGNOSIS — Z79899 Other long term (current) drug therapy: Secondary | ICD-10-CM | POA: Diagnosis not present

## 2023-11-07 DIAGNOSIS — E559 Vitamin D deficiency, unspecified: Secondary | ICD-10-CM | POA: Diagnosis not present

## 2023-11-07 DIAGNOSIS — E118 Type 2 diabetes mellitus with unspecified complications: Secondary | ICD-10-CM | POA: Diagnosis not present

## 2023-11-14 DIAGNOSIS — M1712 Unilateral primary osteoarthritis, left knee: Secondary | ICD-10-CM | POA: Diagnosis not present

## 2023-11-15 DIAGNOSIS — Z0000 Encounter for general adult medical examination without abnormal findings: Secondary | ICD-10-CM | POA: Diagnosis not present

## 2023-11-15 DIAGNOSIS — I10 Essential (primary) hypertension: Secondary | ICD-10-CM | POA: Diagnosis not present

## 2023-11-15 DIAGNOSIS — E118 Type 2 diabetes mellitus with unspecified complications: Secondary | ICD-10-CM | POA: Diagnosis not present

## 2023-11-26 DIAGNOSIS — Z961 Presence of intraocular lens: Secondary | ICD-10-CM | POA: Diagnosis not present

## 2023-11-26 DIAGNOSIS — H26493 Other secondary cataract, bilateral: Secondary | ICD-10-CM | POA: Diagnosis not present

## 2023-11-26 DIAGNOSIS — H524 Presbyopia: Secondary | ICD-10-CM | POA: Diagnosis not present

## 2024-02-12 DIAGNOSIS — K080 Exfoliation of teeth due to systemic causes: Secondary | ICD-10-CM | POA: Diagnosis not present

## 2024-02-15 DIAGNOSIS — K080 Exfoliation of teeth due to systemic causes: Secondary | ICD-10-CM | POA: Diagnosis not present

## 2024-04-04 DIAGNOSIS — Z23 Encounter for immunization: Secondary | ICD-10-CM | POA: Diagnosis not present

## 2024-05-13 DIAGNOSIS — E118 Type 2 diabetes mellitus with unspecified complications: Secondary | ICD-10-CM | POA: Diagnosis not present

## 2024-05-13 DIAGNOSIS — E7800 Pure hypercholesterolemia, unspecified: Secondary | ICD-10-CM | POA: Diagnosis not present

## 2024-05-20 DIAGNOSIS — E7800 Pure hypercholesterolemia, unspecified: Secondary | ICD-10-CM | POA: Diagnosis not present

## 2024-05-20 DIAGNOSIS — E118 Type 2 diabetes mellitus with unspecified complications: Secondary | ICD-10-CM | POA: Diagnosis not present

## 2024-05-20 DIAGNOSIS — I10 Essential (primary) hypertension: Secondary | ICD-10-CM | POA: Diagnosis not present

## 2024-06-02 ENCOUNTER — Encounter: Payer: Self-pay | Admitting: Radiology

## 2024-06-02 DIAGNOSIS — Z1231 Encounter for screening mammogram for malignant neoplasm of breast: Secondary | ICD-10-CM | POA: Diagnosis not present
